# Patient Record
Sex: Male | Born: 1995 | Race: Black or African American | Hispanic: No | Marital: Single | Smoking: Never smoker
Health system: Southern US, Community
[De-identification: ages and names within clinical notes are randomized; demographics above are authoritative.]

## PROBLEM LIST (undated history)

## (undated) DIAGNOSIS — R748 Abnormal levels of other serum enzymes: Secondary | ICD-10-CM

## (undated) DIAGNOSIS — G7000 Myasthenia gravis without (acute) exacerbation: Secondary | ICD-10-CM

## (undated) HISTORY — PX: APPENDECTOMY: SHX54

## (undated) HISTORY — DX: Abnormal levels of other serum enzymes: R74.8

---

## 2017-01-10 ENCOUNTER — Observation Stay (HOSPITAL_COMMUNITY): Payer: No Typology Code available for payment source | Admitting: Certified Registered Nurse Anesthetist

## 2017-01-10 ENCOUNTER — Encounter (HOSPITAL_COMMUNITY): Payer: Self-pay | Admitting: Radiology

## 2017-01-10 ENCOUNTER — Observation Stay (HOSPITAL_COMMUNITY)
Admission: EM | Admit: 2017-01-10 | Discharge: 2017-01-11 | Disposition: A | Discharge status: Home / Self Care | Payer: No Typology Code available for payment source | Attending: General Surgery | Admitting: General Surgery

## 2017-01-10 ENCOUNTER — Emergency Department (HOSPITAL_COMMUNITY): Payer: No Typology Code available for payment source

## 2017-01-10 ENCOUNTER — Encounter (HOSPITAL_COMMUNITY)
Admission: EM | Disposition: A | Discharge status: Home / Self Care | Payer: Self-pay | Source: Home / Self Care | Attending: Emergency Medicine

## 2017-01-10 DIAGNOSIS — K37 Unspecified appendicitis: Secondary | ICD-10-CM | POA: Diagnosis present

## 2017-01-10 DIAGNOSIS — K353 Acute appendicitis with localized peritonitis, without perforation or gangrene: Secondary | ICD-10-CM

## 2017-01-10 HISTORY — PX: LAPAROSCOPIC APPENDECTOMY: SHX408

## 2017-01-10 LAB — URINALYSIS, COMPLETE (UACMP) WITH MICROSCOPIC
Bacteria, UA: NONE SEEN
Bilirubin Urine: NEGATIVE
Glucose, UA: NEGATIVE mg/dL
Hgb urine dipstick: NEGATIVE
Ketones, ur: NEGATIVE mg/dL
Leukocytes, UA: NEGATIVE
Nitrite: NEGATIVE
Protein, ur: 100 mg/dL — AB
Specific Gravity, Urine: 1.023 (ref 1.005–1.030)
pH: 7 (ref 5.0–8.0)

## 2017-01-10 LAB — CBC WITH DIFFERENTIAL/PLATELET
Basophils Absolute: 0 10*3/uL (ref 0.0–0.1)
Basophils Relative: 0 %
Eosinophils Absolute: 0 10*3/uL (ref 0.0–0.7)
Eosinophils Relative: 0 %
HCT: 48 % (ref 39.0–52.0)
Hemoglobin: 15.6 g/dL (ref 13.0–17.0)
Lymphocytes Relative: 6 %
Lymphs Abs: 0.9 10*3/uL (ref 0.7–4.0)
MCH: 29.5 pg (ref 26.0–34.0)
MCHC: 32.5 g/dL (ref 30.0–36.0)
MCV: 90.7 fL (ref 78.0–100.0)
Monocytes Absolute: 0.9 10*3/uL (ref 0.1–1.0)
Monocytes Relative: 6 %
Neutro Abs: 13.1 10*3/uL — ABNORMAL HIGH (ref 1.7–7.7)
Neutrophils Relative %: 88 %
Platelets: 263 10*3/uL (ref 150–400)
RBC: 5.29 MIL/uL (ref 4.22–5.81)
RDW: 12.8 % (ref 11.5–15.5)
WBC: 14.9 10*3/uL — ABNORMAL HIGH (ref 4.0–10.5)

## 2017-01-10 LAB — LIPASE, BLOOD: Lipase: 18 U/L (ref 11–51)

## 2017-01-10 LAB — COMPREHENSIVE METABOLIC PANEL
ALT: 54 U/L (ref 17–63)
AST: 36 U/L (ref 15–41)
Albumin: 4.9 g/dL (ref 3.5–5.0)
Alkaline Phosphatase: 60 U/L (ref 38–126)
Anion gap: 16 — ABNORMAL HIGH (ref 5–15)
BUN: 6 mg/dL (ref 6–20)
CO2: 21 mmol/L — ABNORMAL LOW (ref 22–32)
Calcium: 9.8 mg/dL (ref 8.9–10.3)
Chloride: 97 mmol/L — ABNORMAL LOW (ref 101–111)
Creatinine, Ser: 0.89 mg/dL (ref 0.61–1.24)
GFR calc Af Amer: >60 mL/min (ref >60)
GFR calc non Af Amer: >60 mL/min (ref >60)
Glucose, Bld: 131 mg/dL — ABNORMAL HIGH (ref 65–99)
Potassium: 4 mmol/L (ref 3.5–5.1)
Sodium: 134 mmol/L — ABNORMAL LOW (ref 135–145)
Total Bilirubin: 0.8 mg/dL (ref 0.3–1.2)
Total Protein: 8.7 g/dL — ABNORMAL HIGH (ref 6.5–8.1)

## 2017-01-10 SURGERY — APPENDECTOMY, LAPAROSCOPIC
Anesthesia: General | Site: Abdomen

## 2017-01-10 MED ORDER — PANTOPRAZOLE SODIUM 40 MG IV SOLR
40.0000 mg | Freq: Every day | INTRAVENOUS | Status: DC
  Administered 2017-01-10: 40 mg via INTRAVENOUS
  Filled 2017-01-10: qty 40

## 2017-01-10 MED ORDER — ACETAMINOPHEN 500 MG PO TABS
1000.0000 mg | ORAL_TABLET | Freq: Four times a day (QID) | ORAL | Status: DC
  Administered 2017-01-10 – 2017-01-11 (×4): 1000 mg via ORAL
  Filled 2017-01-10 (×4): qty 2

## 2017-01-10 MED ORDER — DIPHENHYDRAMINE HCL 50 MG/ML IJ SOLN: 12.5000 mg | Freq: Four times a day (QID) | INTRAMUSCULAR | Status: DC | PRN

## 2017-01-10 MED ORDER — PIPERACILLIN-TAZOBACTAM 3.375 G IVPB 30 MIN
3.3750 g | Freq: Once | INTRAVENOUS | Status: AC
  Administered 2017-01-10: 3.375 g via INTRAVENOUS
  Filled 2017-01-10: qty 50

## 2017-01-10 MED ORDER — SODIUM CHLORIDE 0.9 % IV SOLN
INTRAVENOUS | Status: DC
  Administered 2017-01-10 – 2017-01-11 (×3): via INTRAVENOUS

## 2017-01-10 MED ORDER — SODIUM CHLORIDE 0.9 % IV SOLN
INTRAVENOUS | Status: DC
  Administered 2017-01-10: 08:00:00 via INTRAVENOUS

## 2017-01-10 MED ORDER — FENTANYL CITRATE (PF) 100 MCG/2ML IJ SOLN
INTRAMUSCULAR | Status: AC
Start: 2017-01-10 — End: 2017-01-10
  Filled 2017-01-10: qty 4

## 2017-01-10 MED ORDER — CEFAZOLIN SODIUM 1 G IJ SOLR
INTRAMUSCULAR | Status: AC
  Filled 2017-01-10: qty 20

## 2017-01-10 MED ORDER — FENTANYL CITRATE (PF) 100 MCG/2ML IJ SOLN
100.0000 ug | Freq: Once | INTRAMUSCULAR | Status: AC
  Administered 2017-01-10: 100 ug via INTRAVENOUS
  Administered 2017-01-10: 50 ug via INTRAVENOUS
  Filled 2017-01-10: qty 2

## 2017-01-10 MED ORDER — ONDANSETRON HCL 4 MG/2ML IJ SOLN
4.0000 mg | Freq: Four times a day (QID) | INTRAMUSCULAR | Status: DC | PRN
  Administered 2017-01-10 (×2): 4 mg via INTRAVENOUS
  Filled 2017-01-10: qty 2

## 2017-01-10 MED ORDER — MIDAZOLAM HCL 5 MG/5ML IJ SOLN
INTRAMUSCULAR | Status: DC | PRN
Start: 2017-01-10 — End: 2017-01-10
  Administered 2017-01-10: 2 mg via INTRAVENOUS

## 2017-01-10 MED ORDER — ENOXAPARIN SODIUM 40 MG/0.4ML ~~LOC~~ SOLN
40.0000 mg | SUBCUTANEOUS | Status: DC
  Administered 2017-01-11: 40 mg via SUBCUTANEOUS
  Filled 2017-01-10: qty 0.4

## 2017-01-10 MED ORDER — HYDROMORPHONE HCL 2 MG/ML IJ SOLN
0.5000 mg | INTRAMUSCULAR | Status: DC | PRN
  Administered 2017-01-10: 1 mg via INTRAVENOUS
  Filled 2017-01-10: qty 1

## 2017-01-10 MED ORDER — MORPHINE SULFATE (PF) 4 MG/ML IV SOLN
4.0000 mg | Freq: Once | INTRAVENOUS | Status: AC
  Administered 2017-01-10: 4 mg via INTRAVENOUS
  Filled 2017-01-10: qty 1

## 2017-01-10 MED ORDER — CEFAZOLIN SODIUM 1 G IJ SOLR
INTRAMUSCULAR | Status: DC | PRN
  Administered 2017-01-10: 2 g via INTRAMUSCULAR

## 2017-01-10 MED ORDER — PIPERACILLIN-TAZOBACTAM 3.375 G IVPB
3.3750 g | Freq: Three times a day (TID) | INTRAVENOUS | Status: AC
  Administered 2017-01-10: 3.375 g via INTRAVENOUS
  Filled 2017-01-10: qty 50

## 2017-01-10 MED ORDER — DEXAMETHASONE SODIUM PHOSPHATE 10 MG/ML IJ SOLN
INTRAMUSCULAR | Status: AC
  Filled 2017-01-10: qty 1

## 2017-01-10 MED ORDER — SUGAMMADEX SODIUM 200 MG/2ML IV SOLN
INTRAVENOUS | Status: DC | PRN
  Administered 2017-01-10: 250 mg via INTRAVENOUS

## 2017-01-10 MED ORDER — BUPIVACAINE-EPINEPHRINE 0.25% -1:200000 IJ SOLN
INTRAMUSCULAR | Status: DC | PRN
  Administered 2017-01-10: 20 mL

## 2017-01-10 MED ORDER — OXYCODONE HCL 5 MG/5ML PO SOLN: 5.0000 mg | Freq: Once | ORAL | Status: DC | PRN

## 2017-01-10 MED ORDER — ONDANSETRON HCL 4 MG/2ML IJ SOLN
4.0000 mg | Freq: Once | INTRAMUSCULAR | Status: AC
  Administered 2017-01-10: 4 mg via INTRAVENOUS
  Filled 2017-01-10: qty 2

## 2017-01-10 MED ORDER — BUPIVACAINE HCL (PF) 0.25 % IJ SOLN
INTRAMUSCULAR | Status: AC
  Filled 2017-01-10: qty 30

## 2017-01-10 MED ORDER — SUGAMMADEX SODIUM 200 MG/2ML IV SOLN
INTRAVENOUS | Status: AC
  Filled 2017-01-10: qty 4

## 2017-01-10 MED ORDER — HYDROMORPHONE HCL 2 MG/ML IJ SOLN
1.0000 mg | Freq: Once | INTRAMUSCULAR | Status: AC
  Administered 2017-01-10: 1 mg via INTRAVENOUS
  Filled 2017-01-10: qty 1

## 2017-01-10 MED ORDER — HYDROMORPHONE HCL 2 MG/ML IJ SOLN: 0.5000 mg | INTRAMUSCULAR | Status: DC | PRN

## 2017-01-10 MED ORDER — FENTANYL CITRATE (PF) 100 MCG/2ML IJ SOLN
INTRAMUSCULAR | Status: AC
  Administered 2017-01-10: 100 ug
  Filled 2017-01-10: qty 2

## 2017-01-10 MED ORDER — DEXAMETHASONE SODIUM PHOSPHATE 4 MG/ML IJ SOLN
INTRAMUSCULAR | Status: DC | PRN
  Administered 2017-01-10: 10 mg via INTRAVENOUS

## 2017-01-10 MED ORDER — 0.9 % SODIUM CHLORIDE (POUR BTL) OPTIME
TOPICAL | Status: DC | PRN
  Administered 2017-01-10: 1000 mL

## 2017-01-10 MED ORDER — DIPHENHYDRAMINE HCL 12.5 MG/5ML PO ELIX: 12.5000 mg | ORAL_SOLUTION | Freq: Four times a day (QID) | ORAL | Status: DC | PRN

## 2017-01-10 MED ORDER — ONDANSETRON HCL 4 MG/2ML IJ SOLN
INTRAMUSCULAR | Status: AC
  Filled 2017-01-10: qty 2

## 2017-01-10 MED ORDER — OXYCODONE HCL 5 MG PO TABS
5.0000 mg | ORAL_TABLET | ORAL | Status: DC | PRN
  Administered 2017-01-10 – 2017-01-11 (×2): 5 mg via ORAL
  Filled 2017-01-10 (×2): qty 1

## 2017-01-10 MED ORDER — SODIUM CHLORIDE 0.9 % IR SOLN
Status: DC | PRN
  Administered 2017-01-10: 1000 mL

## 2017-01-10 MED ORDER — ROCURONIUM BROMIDE 100 MG/10ML IV SOLN
INTRAVENOUS | Status: DC | PRN
  Administered 2017-01-10: 50 mg via INTRAVENOUS

## 2017-01-10 MED ORDER — OXYCODONE HCL 5 MG PO TABS: 5.0000 mg | ORAL_TABLET | Freq: Once | ORAL | Status: DC | PRN

## 2017-01-10 MED ORDER — LACTATED RINGERS IV SOLN
INTRAVENOUS | Status: DC
  Administered 2017-01-10 (×2): via INTRAVENOUS

## 2017-01-10 MED ORDER — MIDAZOLAM HCL 2 MG/2ML IJ SOLN
INTRAMUSCULAR | Status: AC
  Filled 2017-01-10: qty 2

## 2017-01-10 MED ORDER — LIDOCAINE HCL (CARDIAC) 20 MG/ML IV SOLN
INTRAVENOUS | Status: DC | PRN
  Administered 2017-01-10: 80 mg via INTRAVENOUS

## 2017-01-10 MED ORDER — IOPAMIDOL (ISOVUE-300) INJECTION 61%
INTRAVENOUS | Status: AC
  Administered 2017-01-10: 100 mL
  Filled 2017-01-10: qty 100

## 2017-01-10 MED ORDER — FENTANYL CITRATE (PF) 100 MCG/2ML IJ SOLN: 25.0000 ug | INTRAMUSCULAR | Status: DC | PRN

## 2017-01-10 MED ORDER — PROPOFOL 10 MG/ML IV BOLUS
INTRAVENOUS | Status: DC | PRN
  Administered 2017-01-10: 200 mg via INTRAVENOUS

## 2017-01-10 MED ORDER — SODIUM CHLORIDE 0.9 % IV BOLUS (SEPSIS)
1000.0000 mL | Freq: Once | INTRAVENOUS | Status: AC
  Administered 2017-01-10: 1000 mL via INTRAVENOUS

## 2017-01-10 SURGICAL SUPPLY — 54 items
APPLIER CLIP ROT 10 11.4 M/L (STAPLE)
BANDAGE ADH SHEER 1  50/CT (GAUZE/BANDAGES/DRESSINGS) ×4 IMPLANT
BENZOIN TINCTURE PRP APPL 2/3 (GAUZE/BANDAGES/DRESSINGS) ×4 IMPLANT
BLADE SURG ROTATE 9660 (MISCELLANEOUS) IMPLANT
CANISTER SUCTION 2500CC (MISCELLANEOUS) ×2 IMPLANT
CHLORAPREP W/TINT 26ML (MISCELLANEOUS) ×2 IMPLANT
CLIP APPLIE ROT 10 11.4 M/L (STAPLE) IMPLANT
CLSR STERI-STRIP ANTIMIC 1/2X4 (GAUZE/BANDAGES/DRESSINGS) ×4 IMPLANT
CONT SPEC 4OZ CLIKSEAL STRL BL (MISCELLANEOUS) ×2 IMPLANT
COVER SURGICAL LIGHT HANDLE (MISCELLANEOUS) ×2 IMPLANT
CUTTER FLEX LINEAR 45M (STAPLE) ×2 IMPLANT
DERMABOND ADVANCED (GAUZE/BANDAGES/DRESSINGS)
DERMABOND ADVANCED .7 DNX12 (GAUZE/BANDAGES/DRESSINGS) IMPLANT
DRSG TEGADERM 4X4.75 (GAUZE/BANDAGES/DRESSINGS) ×2 IMPLANT
ELECT REM PT RETURN 9FT ADLT (ELECTROSURGICAL) ×2
ELECTRODE REM PT RTRN 9FT ADLT (ELECTROSURGICAL) ×1 IMPLANT
ENDOLOOP SUT PDS II  0 18 (SUTURE)
ENDOLOOP SUT PDS II 0 18 (SUTURE) IMPLANT
GAUZE SPONGE 2X2 8PLY STRL LF (GAUZE/BANDAGES/DRESSINGS) ×1 IMPLANT
GLOVE BIO SURGEON STRL SZ7.5 (GLOVE) ×2 IMPLANT
GLOVE BIOGEL M STRL SZ7.5 (GLOVE) ×2 IMPLANT
GLOVE BIOGEL PI IND STRL 6.5 (GLOVE) ×1 IMPLANT
GLOVE BIOGEL PI IND STRL 8 (GLOVE) ×1 IMPLANT
GLOVE BIOGEL PI INDICATOR 6.5 (GLOVE) ×1
GLOVE BIOGEL PI INDICATOR 8 (GLOVE) ×1
GOWN STRL REUS W/ TWL LRG LVL3 (GOWN DISPOSABLE) ×3 IMPLANT
GOWN STRL REUS W/TWL 2XL LVL3 (GOWN DISPOSABLE) ×2 IMPLANT
GOWN STRL REUS W/TWL LRG LVL3 (GOWN DISPOSABLE) ×3
GRASPER SUT TROCAR 14GX15 (MISCELLANEOUS) IMPLANT
IRRIG SUCT STRYKERFLOW 2 WTIP (MISCELLANEOUS) ×2
IRRIGATION SUCT STRKRFLW 2 WTP (MISCELLANEOUS) ×1 IMPLANT
KIT BASIN OR (CUSTOM PROCEDURE TRAY) ×2 IMPLANT
KIT ROOM TURNOVER OR (KITS) ×2 IMPLANT
NS IRRIG 1000ML POUR BTL (IV SOLUTION) ×2 IMPLANT
PAD ARMBOARD 7.5X6 YLW CONV (MISCELLANEOUS) ×4 IMPLANT
POUCH RETRIEVAL ECOSAC 10 (ENDOMECHANICALS) ×1 IMPLANT
POUCH RETRIEVAL ECOSAC 10MM (ENDOMECHANICALS) ×1
RELOAD 45 VASCULAR/THIN (ENDOMECHANICALS) ×4 IMPLANT
RELOAD STAPLE TA45 3.5 REG BLU (ENDOMECHANICALS) IMPLANT
SCALPEL HARMONIC ACE (MISCELLANEOUS) ×2 IMPLANT
SCISSORS LAP 5X35 DISP (ENDOMECHANICALS) IMPLANT
SET IRRIG TUBING LAPAROSCOPIC (IRRIGATION / IRRIGATOR) IMPLANT
SPECIMEN JAR SMALL (MISCELLANEOUS) ×2 IMPLANT
SPONGE GAUZE 2X2 STER 10/PKG (GAUZE/BANDAGES/DRESSINGS) ×1
STRIP CLOSURE SKIN 1/2X4 (GAUZE/BANDAGES/DRESSINGS) ×2 IMPLANT
SUT MNCRL AB 4-0 PS2 18 (SUTURE) ×2 IMPLANT
SUT VICRYL 0 UR6 27IN ABS (SUTURE) IMPLANT
TOWEL OR 17X24 6PK STRL BLUE (TOWEL DISPOSABLE) ×2 IMPLANT
TOWEL OR 17X26 10 PK STRL BLUE (TOWEL DISPOSABLE) IMPLANT
TRAY FOLEY CATH 16FR SILVER (SET/KITS/TRAYS/PACK) ×2 IMPLANT
TRAY LAPAROSCOPIC MC (CUSTOM PROCEDURE TRAY) ×2 IMPLANT
TROCAR XCEL BLADELESS 5X75MML (TROCAR) ×4 IMPLANT
TROCAR XCEL BLUNT TIP 100MML (ENDOMECHANICALS) ×2 IMPLANT
TUBING INSUFFLATION (TUBING) ×2 IMPLANT

## 2017-01-10 NOTE — Anesthesia Procedure Notes (Signed)
Procedure Name: Intubation Date/Time: 01/10/2017 5:13 PM Performed by: Oletta Lamas Pre-anesthesia Checklist: Patient identified, Emergency Drugs available, Suction available and Patient being monitored Patient Re-evaluated:Patient Re-evaluated prior to inductionOxygen Delivery Method: Circle System Utilized Preoxygenation: Pre-oxygenation with 100% oxygen Intubation Type: IV induction Ventilation: Mask ventilation without difficulty Laryngoscope Size: Mac and 4 Tube type: Oral Tube size: 7.5 mm Number of attempts: 1 Airway Equipment and Method: Stylet Placement Confirmation: ETT inserted through vocal cords under direct vision,  positive ETCO2 and breath sounds checked- equal and bilateral Secured at: 23 cm Tube secured with: Tape Dental Injury: Teeth and Oropharynx as per pre-operative assessment

## 2017-01-10 NOTE — ED Notes (Signed)
Patient at CT

## 2017-01-10 NOTE — Transfer of Care (Signed)
Immediate Anesthesia Transfer of Care Note  Patient: Dylan Niannthony Darnell Flow Jr.  Procedure(s) Performed: Procedure(s): LAPAROSCOPIC APPENDECTOMY (N/A)  Patient Location: PACU  Anesthesia Type:General  Level of Consciousness: awake, alert , oriented and patient cooperative  Airway & Oxygen Therapy: Patient Spontanous Breathing and Patient connected to nasal cannula oxygen  Post-op Assessment: Report given to RN and Post -op Vital signs reviewed and stable  Post vital signs: Reviewed and stable  Last Vitals:  Vitals:   01/10/17 1945 01/10/17 2000  BP:    Pulse: (!) 110 (!) 110  Resp: 18 20  Temp:      Last Pain:  Vitals:   01/10/17 1901  TempSrc:   PainSc: 0-No pain      Patients Stated Pain Goal: 10 (01/10/17 1651)  Complications: No apparent anesthesia complications

## 2017-01-10 NOTE — Anesthesia Postprocedure Evaluation (Addendum)
Anesthesia Post Note  Patient: Lavone Niannthony Darnell Cavallero Jr.  Procedure(s) Performed: Procedure(s) (LRB): LAPAROSCOPIC APPENDECTOMY (N/A)  Patient location during evaluation: PACU Anesthesia Type: General Level of consciousness: sedated Pain management: pain level controlled Vital Signs Assessment: post-procedure vital signs reviewed and stable Respiratory status: spontaneous breathing and respiratory function stable Cardiovascular status: stable Anesthetic complications: no       Last Vitals:  Vitals:   01/10/17 1945 01/10/17 2000  BP:    Pulse: (!) 110 (!) 110  Resp: 18 20  Temp:      Last Pain:  Vitals:   01/10/17 1901  TempSrc:   PainSc: 0-No pain                 Belva Koziel DANIEL

## 2017-01-10 NOTE — H&P (Signed)
[facility] Consult/Admission Note  Dylan [name]. [birth year]  [id number].    Requesting MD: Dr. Gilford Raid Chief Complaint/Reason for Consult: Appendicitis  HPI:   Patient is a 21 year old African-American male with no significant past medical history who presented to the [facility] ED with complaints of abdominal pain for 3 days. Patient states on Wednesday he began having mild abdominal pain the middle of his abdomen that progressively worsened to constant, severe, sharp, abdominal pain that radiated into his back. Patient states yesterday the pain moved to his right lower quadrant. Associated nausea and vomiting. Nothing made his pain better or worse. Patient denies fever, chills, diarrhea, dysuria, hematuria, chest pain, shortness of breath. Patient has not had a bowel movement in 2 days. Patient is not on blood thinners.  ED course: Labs: WBC 14.9 with left shift, anion gap 16, all other labs unremarkable, VSS CT abdomen and pelvis reveals appendicitis with adjacent stranding and distention measuring up to 8 mm. No evidence of perforation at the time of the study.  ROS:  Review of Systems  Constitutional: Negative for chills, diaphoresis and fever.  HENT: Negative for sore throat.   Eyes: Negative for discharge.  Respiratory: Negative for shortness of breath.   Cardiovascular: Negative for chest pain.  Gastrointestinal: Positive for abdominal pain, nausea and vomiting. Negative for blood in stool and diarrhea.  Genitourinary: Negative for dysuria and hematuria.  Skin: Negative for rash.  Neurological: Negative for dizziness, loss of consciousness and headaches.  All other systems reviewed and are negative.    No family history on file.  History reviewed. No pertinent past medical history.  No past surgical history on file.  Social History:  has no tobacco, alcohol, and drug history on file.  Allergies: No Known Allergies   (Not in a hospital  admission)  Blood pressure 150/92, pulse 91, temperature 97.7 F (36.5 C), temperature source Oral, resp. rate 20, height '5\' 10"'  (1.778 m), weight 250 lb (113.4 kg), SpO2 98 %.  Physical Exam  Constitutional: He is oriented to person, place, and time and well-developed, well-nourished, and in no distress. Vital signs are normal.  Non-toxic appearance. No distress.  HENT:  Head: Normocephalic and atraumatic.  Eyes: Conjunctivae are normal. Pupils are equal, round, and reactive to light. Right eye exhibits no discharge. Left eye exhibits no discharge. No scleral icterus.  Neck: Normal range of motion. Neck supple.  Cardiovascular: Normal rate, regular rhythm, normal heart sounds and intact distal pulses.  Exam reveals no gallop and no friction rub.   No murmur heard. Pulses:      Radial pulses are 2+ on the right side, and 2+ on the left side.       Dorsalis pedis pulses are 2+ on the right side, and 2+ on the left side.  Pulmonary/Chest: Effort normal and breath sounds normal. No respiratory distress. He has no wheezes. He has no rales.  Abdominal: Soft. Normal appearance and bowel sounds are normal. He exhibits no distension. There is generalized tenderness. There is guarding. There is no rigidity.  Moderate generalized TTP  Musculoskeletal: Normal range of motion. He exhibits no edema or deformity.  Neurological: He is alert and oriented to person, place, and time. GCS score is 15.  Skin: Skin is warm and dry. No rash noted. He is not diaphoretic.  Psychiatric: Mood and affect normal.  Nursing note and vitals reviewed.   Results for orders placed or performed during the hospital encounter of 01/10/17 (from the past 48  hour(s))  Comprehensive metabolic panel     Status: Abnormal   Collection Time: 01/10/17  7:31 AM  Result Value Ref Range   Sodium 134 (L) 135 - 145 mmol/L   Potassium 4.0 3.5 - 5.1 mmol/L   Chloride 97 (L) 101 - 111 mmol/L   CO2 21 (L) 22 - 32 mmol/L   Glucose, Bld  131 (H) 65 - 99 mg/dL   BUN 6 6 - 20 mg/dL   Creatinine, Ser 0.89 0.61 - 1.24 mg/dL   Calcium 9.8 8.9 - 10.3 mg/dL   Total Protein 8.7 (H) 6.5 - 8.1 g/dL   Albumin 4.9 3.5 - 5.0 g/dL   AST 36 15 - 41 U/L   ALT 54 17 - 63 U/L   Alkaline Phosphatase 60 38 - 126 U/L   Total Bilirubin 0.8 0.3 - 1.2 mg/dL   GFR calc non Af Amer >60 >60 mL/min   GFR calc Af Amer >60 >60 mL/min    Comment: (NOTE) The eGFR has been calculated using the CKD EPI equation. This calculation has not been validated in all clinical situations. eGFR's persistently <60 mL/min signify possible Chronic Kidney Disease.    Anion gap 16 (H) 5 - 15  Lipase, blood     Status: None   Collection Time: 01/10/17  7:31 AM  Result Value Ref Range   Lipase 18 11 - 51 U/L  CBC WITH DIFFERENTIAL     Status: Abnormal   Collection Time: 01/10/17  7:31 AM  Result Value Ref Range   WBC 14.9 (H) 4.0 - 10.5 K/uL   RBC 5.29 4.22 - 5.81 MIL/uL   Hemoglobin 15.6 13.0 - 17.0 g/dL   HCT 48.0 39.0 - 52.0 %   MCV 90.7 78.0 - 100.0 fL   MCH 29.5 26.0 - 34.0 pg   MCHC 32.5 30.0 - 36.0 g/dL   RDW 12.8 11.5 - 15.5 %   Platelets 263 150 - 400 K/uL   Neutrophils Relative % 88 %   Neutro Abs 13.1 (H) 1.7 - 7.7 K/uL   Lymphocytes Relative 6 %   Lymphs Abs 0.9 0.7 - 4.0 K/uL   Monocytes Relative 6 %   Monocytes Absolute 0.9 0.1 - 1.0 K/uL   Eosinophils Relative 0 %   Eosinophils Absolute 0.0 0.0 - 0.7 K/uL   Basophils Relative 0 %   Basophils Absolute 0.0 0.0 - 0.1 K/uL  Urinalysis, Complete w Microscopic     Status: Abnormal   Collection Time: 01/10/17  7:42 AM  Result Value Ref Range   Color, Urine YELLOW YELLOW   APPearance CLEAR CLEAR   Specific Gravity, Urine 1.023 1.005 - 1.030   pH 7.0 5.0 - 8.0   Glucose, UA NEGATIVE NEGATIVE mg/dL   Hgb urine dipstick NEGATIVE NEGATIVE   Bilirubin Urine NEGATIVE NEGATIVE   Ketones, ur NEGATIVE NEGATIVE mg/dL   Protein, ur 100 (A) NEGATIVE mg/dL   Nitrite NEGATIVE NEGATIVE   Leukocytes,  UA NEGATIVE NEGATIVE   RBC / HPF 0-5 0 - 5 RBC/hpf   WBC, UA 0-5 0 - 5 WBC/hpf   Bacteria, UA NONE SEEN NONE SEEN   Squamous Epithelial / LPF 0-5 (A) NONE SEEN   Mucous PRESENT    Hyaline Casts, UA PRESENT    Ct Abdomen Pelvis W Contrast  Result Date: 01/10/2017 CLINICAL DATA:  Abdominal pain for 2 days. No bowel movement for 3 days. Vomiting. EXAM: CT ABDOMEN AND PELVIS WITH CONTRAST TECHNIQUE: Multidetector CT imaging of the abdomen  and pelvis was performed using the standard protocol following bolus administration of intravenous contrast. CONTRAST:  180m ISOVUE-300 IOPAMIDOL (ISOVUE-300) INJECTION 61% COMPARISON:  KUB from earlier today FINDINGS: Lower chest: No acute abnormality. Hepatobiliary: No focal liver abnormality is seen. No gallstones, gallbladder wall thickening, or biliary dilatation. Pancreas: Unremarkable. No pancreatic ductal dilatation or surrounding inflammatory changes. Spleen: Normal in size without focal abnormality. Adrenals/Urinary Tract: Adrenal glands are unremarkable. Kidneys are normal, without renal calculi, focal lesion, or hydronephrosis. Bladder is unremarkable. Stomach/Bowel: The stomach and small bowel are normal. The colon is normal. The appendix is abnormal with distension and adjacent stranding consistent with appendicitis. The appendix contains fecal material and measures up to 18 mm. Vascular/Lymphatic: No significant vascular findings are present. No enlarged abdominal or pelvic lymph nodes. Reproductive: Prostate is unremarkable. Other: Increased attenuation in the mesentery is likely secondary to the appendicitis. Musculoskeletal: No acute or significant osseous findings. IMPRESSION: 1. Appendicitis with adjacent stranding and distention measuring up to 18 mm. No evidence of perforation at the time of this study. Electronically Signed   By: DDorise BullionIII M.D   On: 01/10/2017 10:34   Dg Abd Acute W/chest  Result Date: 01/10/2017 CLINICAL DATA:  Nausea and  vomiting.  Generalized abdominal pain. EXAM: DG ABDOMEN ACUTE W/ 1V CHEST COMPARISON:  None. FINDINGS: Normal heart size. Normal mediastinal contour. No pneumothorax. No pleural effusion. Lungs appear clear, with no acute consolidative airspace disease and no pulmonary edema. No dilated small bowel loops or air-fluid levels. Mild-to-moderate colonic stool volume. No evidence of pneumatosis or pneumoperitoneum. No pathologic soft tissue calcifications. IMPRESSION: 1. No active disease in the chest. 2. Nonobstructive bowel gas pattern. Electronically Signed   By: JIlona SorrelM.D.   On: 01/10/2017 07:57      Assessment/Plan  Appendicitis - Leukocytosis, afebrile - Will take patient to the OR today for laparoscopic appendectomy  FEN: NPO, IVF, Protonix VTE: SCD's  ID: [id number] 12/2>>  Plan: Patient will go to OR today for laparoscopic appendectomy  Thank you for the consult.  JKalman Drape [facility]am[facility] HospitalSur[facility] 01/10/2017, 11:34 AM Pager: [phone]: [phone]-Fri 7:00 am-4:30 pm Sat-Sun 7:00 am-11:30 am

## 2017-01-10 NOTE — Anesthesia Preprocedure Evaluation (Addendum)
Anesthesia Evaluation  Patient identified by MRN, date of birth, ID band Patient awake    Reviewed: Allergy & Precautions, NPO status , Patient's Chart, lab work & pertinent test results  History of Anesthesia Complications Negative for: history of anesthetic complications  Airway Mallampati: II  TM Distance: >3 FB Neck ROM: Full    Dental  (+) Teeth Intact,    Pulmonary neg pulmonary ROS,    breath sounds clear to auscultation       Cardiovascular negative cardio ROS   Rhythm:Regular     Neuro/Psych negative neurological ROS     GI/Hepatic Neg liver ROS, Acute appendicitis   Endo/Other  Morbid obesity  Renal/GU negative Renal ROS     Musculoskeletal   Abdominal   Peds  Hematology negative hematology ROS (+)   Anesthesia Other Findings   Reproductive/Obstetrics                            Lab Results  Component Value Date   WBC 14.9 (H) 01/10/2017   HGB 15.6 01/10/2017   HCT 48.0 01/10/2017   MCV 90.7 01/10/2017   PLT 263 01/10/2017   Lab Results  Component Value Date   CREATININE 0.89 01/10/2017   BUN 6 01/10/2017   NA 134 (L) 01/10/2017   K 4.0 01/10/2017   CL 97 (L) 01/10/2017   CO2 21 (L) 01/10/2017    Anesthesia Physical Anesthesia Plan  ASA: II  Anesthesia Plan: General   Post-op Pain Management:    Induction: Intravenous  Airway Management Planned: Oral ETT  Additional Equipment: None  Intra-op Plan:   Post-operative Plan: Extubation in OR  Informed Consent: I have reviewed the patients History and Physical, chart, labs and discussed the procedure including the risks, benefits and alternatives for the proposed anesthesia with the patient or authorized representative who has indicated his/her understanding and acceptance.   Dental advisory given  Plan Discussed with: CRNA and Surgeon  Anesthesia Plan Comments:        Anesthesia Quick  Evaluation

## 2017-01-10 NOTE — ED Notes (Signed)
Unsuccessful attempt at giving report to Short Stay.

## 2017-01-10 NOTE — ED Triage Notes (Signed)
Patient comes in with c/o n/v starting yesterday. Patient had bm two days ago. Took laxative yesterday. Started vomiting post med. Total 7x in 24 hrs. Generalized abd pain. No significant hx.

## 2017-01-10 NOTE — Op Note (Signed)
Dylan [name] [id number] [birth year] 01/10/2017  Appendectomy, Lap, Procedure Note  Indications: The patient presented with a history of right-sided abdominal pain. A CT revealed findings consistent with acute appendicitis.  Pre-operative Diagnosis: Acute appendicitis without mention of peritonitis  Post-operative Diagnosis: Same  Surgeon: Atilano Ina   Assistants: none  Anesthesia: General endotracheal anesthesia  Procedure Details  The patient was seen again in the Holding Room. The risks, benefits, complications, treatment options, and expected outcomes were discussed with the patient and/or family. The possibilities of perforation of viscus, bleeding, recurrent infection, the need for additional procedures, failure to diagnose a condition, and creating a complication requiring transfusion or operation were discussed. There was concurrence with the proposed plan and informed consent was obtained. The site of surgery was properly noted. The patient was taken to Operating Room, identified as Dylan [name]. and the procedure verified as Appendectomy. A Time Out was held and the above information confirmed.  The patient was placed in the supine position and general anesthesia was induced, along with placement of orogastric tube, SCDs, and a Foley catheter. The abdomen was prepped and draped in a sterile fashion. A 1.5 centimeter infraumbilical incision was made.  The umbilical stalk was elevated, and the midline fascia was incised with a #11 blade.  A Kelly clamp was used to confirm entrance into the peritoneal cavity.  A pursestring suture was passed around the incision with a 0 Vicryl.  A 12mm Hasson was introduced into the abdomen and the tails of the suture were used to hold the Hasson in place.   The pneumoperitoneum was then established to steady pressure of 15 mmHg.  Additional 5 mm cannulas then placed in the left lower quadrant of the abdomen and the suprapubic  region under direct visualization. A careful evaluation of the entire abdomen was carried out. The patient was placed in Trendelenburg and left lateral decubitus position. The small intestines were retracted in the cephalad and left lateral direction away from the pelvis and right lower quadrant. The patient was found to have an inflammed appendix that was stuck to the right pelvic inlet. There was no evidence of perforation.  The appendix was carefully dissected. It became evident that the appendix was retro-ileal. I ended up mobilizing some the cecal base laterally because I felt that was the easier way to approach the appendix base the posterior veil of TI mesentery was covering the midportion of the appendix. When I did this I was able to visualize the path of the proximal appendix going into the cecum. It appeared that the appendix was coming off of the cecum slightly medial. The TI fat pad was reflected upward. The appendix was was skeletonized with the harmonic scalpel.   The appendix was divided at its base using an endo-GIA stapler with a white load. No appendiceal stump was left in place. I stapled parallel to the junction of the TI and cecum.. The appendix was removed from the abdomen with an Endocatch bag through the umbilical port.  There was no evidence of bleeding, leakage, or complication after division of the appendix. Irrigation was also performed and irrigate suctioned from the abdomen as well.  The umbilical port site was closed with the purse string suture. The closure was viewed laparoscopically. There was no residual palpable fascial defect.  The trocar site skin wounds were closed with 4-0 Monocryl. Benzoin, steri strips, bandages were applied to the skin incisions.  Instrument, sponge, and needle counts were correct at the  conclusion of the case.   Findings: The appendix was found to be inflamed. There were not signs of necrosis.  There was not perforation. There was not abscess  formation.  Estimated Blood Loss:  Minimal         Drains: none         Specimens: appendix         Complications:  None; patient tolerated the procedure well.         Disposition: PACU - hemodynamically stable.         Condition: stable  Mary SellaEric M. Andrey CampanileWilson, MD, FACS General, Bariatric, & Minimally Invasive Surgery [facility], [place]

## 2017-01-10 NOTE — ED Provider Notes (Signed)
[facility]-EMERGENCY DEPT Provider Note   CSN: [id number] Arrival date & time: 01/10/17  0650     History   Chief Complaint Chief Complaint  Patient presents with  . Abdominal Pain  . Emesis    HPI Dylan Niannthony Darnell Alderman Jr. is a 21 y.o. male.  Pt presents to the ED today with abdominal pain and n/v.  The pt had not had a bm in 2 days, so he took a laxative.  That made him vomit more.  The pt said his pain is diffuse.  The pt denies f/c or diarrhea.      History reviewed. No pertinent past medical history.  There are no active problems to display for this patient.   No past surgical history on file.     Home Medications    Prior to Admission medications   Not on File    Family History No family history on file.  Social History Social History  Substance Use Topics  . Smoking status: Not on file  . Smokeless tobacco: Not on file  . Alcohol use Not on file     Allergies   Patient has no known allergies.   Review of Systems Review of Systems  Gastrointestinal: Positive for abdominal pain and vomiting.  All other systems reviewed and are negative.    Physical Exam Updated Vital Signs BP 150/91 (BP Location: Right Arm) Comment: Simultaneous filing. User may not have seen previous data.  Pulse 87 Comment: Simultaneous filing. User may not have seen previous data.  Temp 97.7 F (36.5 C) (Oral)   Resp 20   Ht 5\' 10"  (1.778 m)   Wt 250 lb (113.4 kg)   SpO2 99% Comment: Simultaneous filing. User may not have seen previous data.  BMI 35.87 kg/m   Physical Exam  Constitutional: He is oriented to person, place, and time. He appears well-developed and well-nourished.  HENT:  Head: Normocephalic and atraumatic.  Right Ear: External ear normal.  Left Ear: External ear normal.  Nose: Nose normal.  Mouth/Throat: Mucous membranes are dry.  Eyes: Conjunctivae and EOM are normal. Pupils are equal, round, and reactive to light.  Neck: Normal range of motion. Neck  supple.  Cardiovascular: Normal rate, regular rhythm, normal heart sounds and intact distal pulses.   Pulmonary/Chest: Effort normal and breath sounds normal.  Abdominal: Soft. Bowel sounds are normal. There is generalized tenderness.  Musculoskeletal: Normal range of motion.  Neurological: He is alert and oriented to person, place, and time.  Skin: Skin is warm and dry.  Psychiatric: He has a normal mood and affect. His behavior is normal. Judgment and thought content normal.  Nursing note and vitals reviewed.    ED Treatments / Results  Labs (all labs ordered are listed, but only abnormal results are displayed) Labs Reviewed  COMPREHENSIVE METABOLIC PANEL - Abnormal; Notable for the following:       Result Value   Sodium 134 (*)    Chloride 97 (*)    CO2 21 (*)    Glucose, Bld 131 (*)    Total Protein 8.7 (*)    Anion gap 16 (*)    All other components within normal limits  URINALYSIS, COMPLETE (UACMP) WITH MICROSCOPIC - Abnormal; Notable for the following:    Protein, ur 100 (*)    Squamous Epithelial / LPF 0-5 (*)    All other components within normal limits  CBC WITH DIFFERENTIAL/PLATELET - Abnormal; Notable for the following:    WBC 14.9 (*)  Neutro Abs 13.1 (*)    All other components within normal limits  LIPASE, BLOOD    EKG  EKG Interpretation None       Radiology Ct Abdomen Pelvis W Contrast  Result Date: 01/10/2017 CLINICAL DATA:  Abdominal pain for 2 days. No bowel movement for 3 days. Vomiting. EXAM: CT ABDOMEN AND PELVIS WITH CONTRAST TECHNIQUE: Multidetector CT imaging of the abdomen and pelvis was performed using the standard protocol following bolus administration of intravenous contrast. CONTRAST:  ISOVUE-300 IOPAMIDOL (ISOVUE-300) INJECTION 61% COMPARISON:  KUB from earlier today FINDINGS: Lower chest: No acute abnormality. Hepatobiliary: No focal liver abnormality is seen. No gallstones, gallbladder wall thickening, or biliary dilatation.  Pancreas: Unremarkable. No pancreatic ductal dilatation or surrounding inflammatory changes. Spleen: Normal in size without focal abnormality. Adrenals/Urinary Tract: Adrenal glands are unremarkable. Kidneys are normal, without renal calculi, focal lesion, or hydronephrosis. Bladder is unremarkable. Stomach/Bowel: The stomach and small bowel are normal. The colon is normal. The appendix is abnormal with distension and adjacent stranding consistent with appendicitis. The appendix contains fecal material and measures up to 18 mm. Vascular/Lymphatic: No significant vascular findings are present. No enlarged abdominal or pelvic lymph nodes. Reproductive: Prostate is unremarkable. Other: Increased attenuation in the mesentery is likely secondary to the appendicitis. Musculoskeletal: No acute or significant osseous findings. IMPRESSION: 1. Appendicitis with adjacent stranding and distention measuring up to 18 mm. No evidence of perforation at the time of this study. Electronically Signed   By: Gerome Sam III M.D   On: 01/10/2017 10:34   Dg Abd Acute W/chest  Result Date: 01/10/2017 CLINICAL DATA:  Nausea and vomiting.  Generalized abdominal pain. EXAM: DG ABDOMEN ACUTE W/ 1V CHEST COMPARISON:  None. FINDINGS: Normal heart size. Normal mediastinal contour. No pneumothorax. No pleural effusion. Lungs appear clear, with no acute consolidative airspace disease and no pulmonary edema. No dilated small bowel loops or air-fluid levels. Mild-to-moderate colonic stool volume. No evidence of pneumatosis or pneumoperitoneum. No pathologic soft tissue calcifications. IMPRESSION: 1. No active disease in the chest. 2. Nonobstructive bowel gas pattern. Electronically Signed   By: Delbert Phenix M.D.   On: 01/10/2017 07:57    Procedures Procedures (including critical care time)  Medications Ordered in ED Medications  sodium chloride 0.9 % bolus 1,000 mL (0 mLs Intravenous Stopped 01/10/17 1102)    And  0.9 %  sodium  chloride infusion ( Intravenous New Bag/Given 01/10/17 0741)  piperacillin-tazobactam (ZOSYN) IVPB 3.375 g (3.375 g Intravenous New Bag/Given 01/10/17 1102)  ondansetron (ZOFRAN) injection 4 mg (4 mg Intravenous Given 01/10/17 0739)  morphine 4 MG/ML injection 4 mg (4 mg Intravenous Given 01/10/17 0739)  iopamidol (ISOVUE-300) 61 % injection (100 mLs  Contrast Given 01/10/17 1002)  HYDROmorphone (DILAUDID) injection 1 mg (1 mg Intravenous Given 01/10/17 0927)  HYDROmorphone (DILAUDID) injection 1 mg (1 mg Intravenous Given 01/10/17 1102)     Initial Impression / Assessment and Plan / ED Course  I have reviewed the triage vital signs and the nursing notes.  Pertinent labs & imaging results that were available during my care of the patient were reviewed by me and considered in my medical decision making (see chart for details).    Pt d/w surgery who will be down to eval pt and likely take to the OR.  Final Clinical Impressions(s) / ED Diagnoses   Final diagnoses:  Acute appendicitis with localized peritonitis    New Prescriptions New Prescriptions   No medications on file  Jacalyn Lefevre, MD 01/10/17 [phone]

## 2017-01-10 NOTE — ED Notes (Signed)
Patient taken to xray.

## 2017-01-11 ENCOUNTER — Encounter (HOSPITAL_COMMUNITY): Payer: Self-pay | Admitting: General Surgery

## 2017-01-11 LAB — CBC
HCT: 42.2 % (ref 39.0–52.0)
Hemoglobin: 13.9 g/dL (ref 13.0–17.0)
MCH: 29.9 pg (ref 26.0–34.0)
MCHC: 32.9 g/dL (ref 30.0–36.0)
MCV: 90.8 fL (ref 78.0–100.0)
Platelets: 240 10*3/uL (ref 150–400)
RBC: 4.65 MIL/uL (ref 4.22–5.81)
RDW: 12.9 % (ref 11.5–15.5)
WBC: 18.9 10*3/uL — ABNORMAL HIGH (ref 4.0–10.5)

## 2017-01-11 LAB — BASIC METABOLIC PANEL
Anion gap: 8 (ref 5–15)
BUN: 9 mg/dL (ref 6–20)
CO2: 26 mmol/L (ref 22–32)
Calcium: 8.9 mg/dL (ref 8.9–10.3)
Chloride: 104 mmol/L (ref 101–111)
Creatinine, Ser: 0.84 mg/dL (ref 0.61–1.24)
GFR calc Af Amer: >60 mL/min (ref >60)
GFR calc non Af Amer: >60 mL/min (ref >60)
Glucose, Bld: 120 mg/dL — ABNORMAL HIGH (ref 65–99)
Potassium: 3.8 mmol/L (ref 3.5–5.1)
Sodium: 138 mmol/L (ref 135–145)

## 2017-01-11 MED ORDER — SODIUM CHLORIDE 0.9 % IV SOLN
250.0000 mL | INTRAVENOUS | Status: DC | PRN
Start: 2017-01-11 — End: 2017-01-11

## 2017-01-11 MED ORDER — SODIUM CHLORIDE 0.9% FLUSH
3.0000 mL | Freq: Two times a day (BID) | INTRAVENOUS | Status: DC
  Administered 2017-01-11: 3 mL via INTRAVENOUS

## 2017-01-11 MED ORDER — OXYCODONE HCL 5 MG PO TABS: 5.0000 mg | ORAL_TABLET | ORAL | 0 refills | Status: DC | PRN

## 2017-01-11 MED ORDER — SODIUM CHLORIDE 0.9% FLUSH
3.0000 mL | INTRAVENOUS | Status: DC | PRN
Start: 2017-01-11 — End: 2017-01-11

## 2017-01-11 NOTE — Progress Notes (Signed)
Received patient from PACU from lap appe surgery.  Patient AOx4, ambulatory, VS stable, pain at 5/10 and O2Sat at 98% on RA.  Patient oriented to room, bed controls and call light.  Patient then ambulated to bath room to void.  Administered PRN pain medication Oxy IR 5mg  PO as per order.  Patient now resting on bed comfortably watching TV with family members at bedside.  Will monitor.

## 2017-01-11 NOTE — Discharge Planning (Signed)
Patient IV removed.  Discharge papers given, explained and educated.  Informed of suggested FU appt and asked to call Dr. On Monday to set up.  Script given to patient.  RN assessment and VS revealed stability for DC to home was ready.  Will be wheeled to front and family transporting home via car.

## 2017-01-11 NOTE — Discharge Summary (Signed)
Patient ID: Dylan [name]. [id number] 20 y.o. 11/08/1996  01/10/2017  Discharge date and time: No discharge date for patient encounter.  Admitting Physician: Gaynelle AduEric Wilson, MD  Discharge Physician: Vanita PandaHOMAS, Uilani Sanville C.  Admission Diagnoses: Acute appendicitis with localized peritonitis [K35.3]  Discharge Diagnoses: acute appendicitis  Operations: Procedure(s): LAPAROSCOPIC APPENDECTOMY    Discharged Condition: good    Hospital Course: Pt admitted for overnight observation.  Did well  Consults: None  Significant Diagnostic Studies: labs: cbc, chemistry  Treatments: IV hydration, abx  Disposition: Home

## 2017-01-11 NOTE — Discharge Instructions (Signed)

## 2017-05-10 NOTE — Addendum Note (Signed)
Addendum  created 05/10/17 0902 by Clydell Alberts, MD   Sign clinical note    

## 2017-12-09 ENCOUNTER — Emergency Department (HOSPITAL_COMMUNITY)
Admission: EM | Admit: 2017-12-09 | Discharge: 2017-12-09 | Disposition: A | Discharge status: Home / Self Care | Payer: No Typology Code available for payment source | Attending: Emergency Medicine | Admitting: Emergency Medicine

## 2017-12-09 ENCOUNTER — Encounter (HOSPITAL_COMMUNITY): Payer: Self-pay

## 2017-12-09 ENCOUNTER — Other Ambulatory Visit: Payer: Self-pay

## 2017-12-09 ENCOUNTER — Emergency Department (HOSPITAL_COMMUNITY): Payer: No Typology Code available for payment source

## 2017-12-09 DIAGNOSIS — Y929 Unspecified place or not applicable: Secondary | ICD-10-CM | POA: Diagnosis not present

## 2017-12-09 DIAGNOSIS — M545 Low back pain, unspecified: Secondary | ICD-10-CM

## 2017-12-09 DIAGNOSIS — Y939 Activity, unspecified: Secondary | ICD-10-CM | POA: Insufficient documentation

## 2017-12-09 DIAGNOSIS — M62838 Other muscle spasm: Secondary | ICD-10-CM | POA: Diagnosis not present

## 2017-12-09 DIAGNOSIS — M25562 Pain in left knee: Secondary | ICD-10-CM | POA: Diagnosis not present

## 2017-12-09 DIAGNOSIS — M25561 Pain in right knee: Secondary | ICD-10-CM | POA: Insufficient documentation

## 2017-12-09 DIAGNOSIS — Y999 Unspecified external cause status: Secondary | ICD-10-CM | POA: Insufficient documentation

## 2017-12-09 MED ORDER — METHOCARBAMOL 750 MG PO TABS: 700.0000 mg | ORAL_TABLET | Freq: Three times a day (TID) | ORAL | 0 refills | Status: DC | PRN

## 2017-12-09 MED ORDER — IBUPROFEN 400 MG PO TABS
600.0000 mg | ORAL_TABLET | Freq: Once | ORAL | Status: AC
  Administered 2017-12-09: 600 mg via ORAL
  Filled 2017-12-09: qty 1
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
## 2017-12-09 NOTE — Discharge Instructions (Signed)
Please take Ibuprofen (Advil, motrin) and Tylenol (acetaminophen) to relieve your pain.  You may take up to 600 MG (3 pills) of normal strength ibuprofen every 8 hours as needed.  In between doses of ibuprofen you make take tylenol, up to 1,000 mg (two extra strength pills).  Do not take more than 3,000 mg tylenol in a 24 hour period.  Please check all medication labels as many medications such as pain and cold medications may contain tylenol.  Do not drink alcohol while taking these medications.  Do not take other NSAID'S while taking ibuprofen (such as aleve or naproxen).  Please take ibuprofen with food to decrease stomach upset. ° °The best way to get rid of muscle pain is by taking NSAIDS, using heat, massage therapy, and gentle stretching/range of motion exercises. ° °You are being prescribed a medication which may make you sleepy. For 24 hours after one dose please do not drive, operate heavy machinery, care for a small child with out another adult present, or perform any activities that may cause harm to you or someone else if you were to fall asleep or be impaired.  ° °

## 2018-01-07 ENCOUNTER — Ambulatory Visit: Payer: Self-pay | Admitting: Medical

## 2018-01-19 ENCOUNTER — Ambulatory Visit: Payer: 59 | Admitting: Medical

## 2018-01-19 ENCOUNTER — Encounter: Payer: Self-pay | Admitting: Medical

## 2018-01-19 VITALS — BP 112/70 | HR 92 | Temp 98.5°F | Ht 69.0 in | Wt 213.2 lb

## 2018-01-19 DIAGNOSIS — M6281 Muscle weakness (generalized): Secondary | ICD-10-CM | POA: Diagnosis not present

## 2018-01-19 DIAGNOSIS — R490 Dysphonia: Secondary | ICD-10-CM

## 2018-01-19 DIAGNOSIS — R682 Dry mouth, unspecified: Secondary | ICD-10-CM

## 2018-01-19 DIAGNOSIS — R6889 Other general symptoms and signs: Secondary | ICD-10-CM | POA: Diagnosis not present

## 2018-01-19 LAB — POCT URINALYSIS DIP (PROADVANTAGE DEVICE)
Bilirubin, UA: NEGATIVE
Blood, UA: NEGATIVE
Glucose, UA: NEGATIVE mg/dL
Ketones, POC UA: NEGATIVE mg/dL
Leukocytes, UA: NEGATIVE
Nitrite, UA: NEGATIVE
Specific Gravity, Urine: 1.03
Urobilinogen, Ur: NEGATIVE
pH, UA: 6 (ref 5.0–8.0)

## 2018-01-19 NOTE — Progress Notes (Signed)
Subjective: Chief Complaint  Patient presents with  . New Patient (Initial Visit)    congestion , weakness  x 1 year , foot pain x 1 week    Here for new patient to establish care  Had been going to the gym and lifting weights regularly before August, but had period of time when he couldn't go to the gym 2 weeks.  When he went back in the gym, tried to start back at where he left off.   However, he notes at that point having extreme weakness.   Gets some discomfort in muscles, but not much pain, just very weak.  August 2018 started feeling these symptoms.  Since then gets very weak with any physical activity. 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  He was overweight when living in [place], but since moving here to [place] in the past year, has lost 57 lb in the past year, tried to exercise hard.  Tongue seems dry a lot, gets a lot of mucous, feels like it chokes him.  Has to take little breaths due to mucous.  No SOB, no hemoptysis.  Does get some chest pains. No hx/o asthma.  No other aggravating or relieving factors. No other complaint.  History reviewed. No pertinent past medical history.  Current Outpatient Medications on File Prior to Visit  Medication Sig Dispense Refill  . Pseudoephedrine HCl (SUDAFED 12 HOUR PO) Take by mouth.    . methocarbamol (ROBAXIN) 750 MG tablet Take 1-2 tablets (750-1,500 mg total) by mouth every 8 (eight) hours as needed for muscle spasms. (Patient not taking: Reported on 01/19/2018) 20 tablet 0  . oxyCODONE (OXY IR/ROXICODONE) 5 MG immediate release tablet Take 1-2 tablets (5-10 mg total) by mouth every 4 (four) hours as needed for moderate pain. (Patient not taking: Reported on 01/19/2018) 15 tablet 0   No current facility-administered medications on file prior to visit.    Family History  Problem Relation Age of Onset  . Carpal tunnel syndrome Mother   . Diabetes  Father   . Cancer Paternal Aunt        breast  . Stroke Neg Hx   . Heart disease Neg Hx     ROS as in subjective    Objective: Blood pressure 112/70, pulse 92, temperature 98.5 F (36.9 C), height 5\' 9"  (1.753 m), weight 213 lb 3.2 oz (96.7 kg), SpO2 98 %.   General appearance: alert, no distress, WD/WN, AA male HEENT: normocephalic, sclerae anicteric, PERRLA, EOMi, nares patent, no discharge or erythema, pharynx normal Oral cavity: MMM, no lesions Neck: supple, no lymphadenopathy, no thyromegaly, no masses Heart: RRR, normal S1, S2, no murmurs Lungs: CTA bilaterally, no wheezes, rhonchi, or rales Back: non tender Musculoskeletal: arms and legs mildly tender to touch in general, otherwise nontender, no swelling, no obvious deformity Extremities: no edema, no cyanosis, no clubbing Pulses: 2+ symmetric, upper and lower extremities, normal cap refill Neurological: alert, oriented x 3, CN2-12 intact, strength normal upper extremities and lower extremities, sensation normal throughout, DTRs somewhat decreased, around 1+ throughout, no cerebellar signs, gait normal Psychiatric: normal affect, behavior normal, pleasant     Assessment: Encounter Diagnoses  Name Primary?  . Muscle weakness Yes  . Congestion of throat   . Dry mouth   . Hoarse     Plan We discussed his symptoms and concerns, possible causes although the list can be wide  at this point.  He has a several month history of generalized muscle weakness, hoarseness, dry mouth, without any significant family history or prior personal history other than marijuana use.  He does smoke marijuana at least twice a week.  Labs today, hydrate well, and pending labs consider other necessary evaluation.   Russell was seen today for new patient (initial visit).  Diagnoses and all orders for this visit:  Muscle weakness -     Comprehensive metabolic panel -     CBC with Differential/Platelet -     TSH -     CK -      Sedimentation rate  Congestion of throat -     Comprehensive metabolic panel -     CBC with Differential/Platelet -     TSH -     CK -     Sedimentation rate  Dry mouth -     Comprehensive metabolic panel -     CBC with Differential/Platelet -     TSH -     CK -     Sedimentation rate  Hoarse

## 2018-01-19 NOTE — Addendum Note (Signed)
Addended by: Winn JockVALENTINE, Kyri Dai N on: 01/19/2018 11:33 AM   Modules accepted: Orders

## 2018-01-20 LAB — CK: Total CK: 387 U/L — ABNORMAL HIGH (ref 24–204)

## 2018-01-20 LAB — CBC WITH DIFFERENTIAL/PLATELET
Basophils Absolute: 0 10*3/uL (ref 0.0–0.2)
Basos: 0 %
EOS (ABSOLUTE): 0 10*3/uL (ref 0.0–0.4)
Eos: 1 %
Hematocrit: 47.5 % (ref 37.5–51.0)
Hemoglobin: 15.2 g/dL (ref 13.0–17.7)
Immature Grans (Abs): 0 10*3/uL (ref 0.0–0.1)
Immature Granulocytes: 0 %
Lymphocytes Absolute: 1.6 10*3/uL (ref 0.7–3.1)
Lymphs: 30 %
MCH: 30 pg (ref 26.6–33.0)
MCHC: 32 g/dL (ref 31.5–35.7)
MCV: 94 fL (ref 79–97)
Monocytes Absolute: 0.7 10*3/uL (ref 0.1–0.9)
Monocytes: 12 %
Neutrophils Absolute: 3.1 10*3/uL (ref 1.4–7.0)
Neutrophils: 57 %
Platelets: 264 10*3/uL (ref 150–379)
RBC: 5.07 x10E6/uL (ref 4.14–5.80)
RDW: 13.7 % (ref 12.3–15.4)
WBC: 5.5 10*3/uL (ref 3.4–10.8)

## 2018-01-20 LAB — COMPREHENSIVE METABOLIC PANEL
ALT: 34 IU/L (ref 0–44)
AST: 23 IU/L (ref 0–40)
Albumin/Globulin Ratio: 1.6 (ref 1.2–2.2)
Albumin: 5 g/dL (ref 3.5–5.5)
Alkaline Phosphatase: 71 IU/L (ref 39–117)
BUN/Creatinine Ratio: 12 (ref 9–20)
BUN: 11 mg/dL (ref 6–20)
Bilirubin Total: 0.5 mg/dL (ref 0.0–1.2)
CO2: 23 mmol/L (ref 20–29)
Calcium: 9.6 mg/dL (ref 8.7–10.2)
Chloride: 104 mmol/L (ref 96–106)
Creatinine, Ser: 0.92 mg/dL (ref 0.76–1.27)
GFR calc Af Amer: 137 mL/min/{1.73_m2} (ref >59)
GFR calc non Af Amer: 118 mL/min/{1.73_m2} (ref >59)
Globulin, Total: 3.1 g/dL (ref 1.5–4.5)
Glucose: 87 mg/dL (ref 65–99)
Potassium: 4.3 mmol/L (ref 3.5–5.2)
Sodium: 141 mmol/L (ref 134–144)
Total Protein: 8.1 g/dL (ref 6.0–8.5)

## 2018-01-20 LAB — SEDIMENTATION RATE: Sed Rate: 17 mm/hr — ABNORMAL HIGH (ref 0–15)

## 2018-01-20 LAB — TSH: TSH: 0.729 u[IU]/mL (ref 0.450–4.500)

## 2018-02-04 ENCOUNTER — Ambulatory Visit: Payer: 59 | Admitting: Medical

## 2018-02-17 ENCOUNTER — Encounter: Payer: Self-pay | Admitting: Medical

## 2018-02-17 ENCOUNTER — Ambulatory Visit: Payer: 59 | Admitting: Medical

## 2018-02-17 VITALS — BP 114/76 | HR 87 | Wt 219.0 lb

## 2018-02-17 DIAGNOSIS — J0100 Acute maxillary sinusitis, unspecified: Secondary | ICD-10-CM | POA: Diagnosis not present

## 2018-02-17 DIAGNOSIS — R700 Elevated erythrocyte sedimentation rate: Secondary | ICD-10-CM | POA: Diagnosis not present

## 2018-02-17 DIAGNOSIS — J309 Allergic rhinitis, unspecified: Secondary | ICD-10-CM | POA: Diagnosis not present

## 2018-02-17 DIAGNOSIS — R748 Abnormal levels of other serum enzymes: Secondary | ICD-10-CM

## 2018-02-17 DIAGNOSIS — M6281 Muscle weakness (generalized): Secondary | ICD-10-CM | POA: Diagnosis not present

## 2018-02-17 MED ORDER — AMOXICILLIN 875 MG PO TABS: 875.0000 mg | ORAL_TABLET | Freq: Two times a day (BID) | ORAL | 0 refills | Status: DC

## 2018-02-17 MED ORDER — FLUTICASONE PROPIONATE 50 MCG/ACT NA SUSP
2.0000 | Freq: Every day | NASAL | 2 refills | Status: DC
Start: 2018-02-17 — End: 2019-06-22

## 2018-02-17 NOTE — Patient Instructions (Signed)
Encounter Diagnoses  Name Primary?  . Elevated CK Yes  . Elevated sed rate   . Muscle weakness   . Acute maxillary sinusitis, recurrence not specified   . Allergic rhinitis, unspecified seasonality, unspecified trigger     Recommendations:  We are referring you to a rheumatologist.  This is a specialist who deals in muscle and joint diseases.  Given your extreme muscle weakness and mildly abnormal blood test called the CK and sed rate, want to make sure there is nothing underlying going on   I recommend you drink plenty of water daily such a 64 ounces of water daily  Try and get good rest every night such as 7-8 hours of sleep per night  Avoid long periods of strenuous activity for now  You also have symptoms that suggest allergy problems and sinus infection  I recommend a short-term round of amoxicillin antibiotic, 1 tablet twice daily for 10 days  In general during the spring and fall I recommend you use Flonase nasal spray 1-2 sprays once daily in each nostril to reduce inflammation and to help with allergy symptoms     Creatine Kinase Test Why am I having this test? The creatine kinase (CK) test is performed to determine if there has been damage to muscle tissue in your body. This test can be used to help diagnose heart attack, neurologic diseases, or skeletal diseases. Three different forms of CK are present in your body. They are referred to as isoenzymes:  CK-MM is found in your skeletal muscles and heart.  CK-MB is found mostly in your heart.  CK-BB is found mostly in your brain.  What kind of sample is taken? A blood sample is required for this test. It is usually collected by inserting a needle into a vein. How do I prepare for this test? There is no preparation required for this test. Be aware that your health care provider may require blood samples to be taken at regular intervals for up to 1 week. What are the reference ranges? Reference ranges are  considered healthy ranges established after testing a large group of healthy people. Reference ranges may vary among different people, labs, and hospitals. It is your responsibility to obtain your test results. Ask the lab or department performing the test when and how you will get your results. The reference ranges for the CK test are as follows: Total CK:  Adult or elderly (values are higher after exercise): ? Male: 55-170 units/L or 55-170 units/L (SI units). ? Male: 30-135 units/L or 30-135 units/L (SI units).  Newborn: 68-580 units/L (SI units). Isoenzymes:  CK-MM: 100%.  CK-MB: 0%.  CK-BB: 0%. What do the results mean? Levels of total CK that are above the reference ranges may indicate injury or diseases affecting the heart, skeletal muscle, or brain. Increased levels of CK-MM isoenzyme may indicate:  Certain diseases affecting the skeletal muscle.  Recent surgery, trauma, or injury.  Conditions that cause convulsions.  Increased levels of CK-MB isoenzyme may indicate:  Recent heart attack.  Other conditions that cause injury to the heart muscle.  Increased levels of CK-BB isoenzyme may indicate:  Diseases that affect the central nervous system.  Certain psychiatric therapies.  Certain types of cancer.  Injury to the lungs.  Talk with your health care provider to discuss your results, treatment options, and if necessary, the need for more tests. Talk with your health care provider if you have any questions about your results. Talk with your health care provider  to discuss your results, treatment options, and if necessary, the need for more tests. Talk with your health care provider if you have any questions about your results. This information is not intended to replace advice given to you by your health care provider. Make sure you discuss any questions you have with your health care provider. Document Released: 12/26/2004 Document Revised: 07/30/2016 Document  Reviewed: 04/21/2014 Elsevier Interactive Patient Education  [employer]2018 [employer]vier Inc.

## 2018-02-17 NOTE — Progress Notes (Signed)
Subjective: Chief Complaint  Patient presents with  . Follow-up    follow up  for muscle weakness   At his new patient visit in February he complained of muscle weakness and mucous congestion.   He had been going to the gym and lifting weights regularly before August, but had period of time when he couldn't go to the gym 2 weeks.  When he went back in the gym, tried to start back at where he left off.   However, he notes at that point having extreme weakness.   Gets some discomfort in muscles, but not much pain, just very weak.  August 2018 started feeling these symptoms.  Since then gets very weak with any physical activity. 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  He was overweight when living in [place], but since moving here to [place] in the past year, has lost 57 lb in the past year, tried to exercise hard.  Elbows and shoulders ache at times.  No joint swelling  Tongue seems dry a lot, gets a lot of mucous, feels like it chokes him.  Has to take little breaths due to mucous.  No SOB, no hemoptysis.  Does get some chest pains. No hx/o asthma.  No other aggravating or relieving factors. No other complaint.  No past medical history on file.  Current Outpatient Medications on File Prior to Visit  Medication Sig Dispense Refill  . Pseudoephedrine HCl (SUDAFED 12 HOUR PO) Take by mouth.    . methocarbamol (ROBAXIN) 750 MG tablet Take 1-2 tablets (750-1,500 mg total) by mouth every 8 (eight) hours as needed for muscle spasms. (Patient not taking: Reported on 01/19/2018) 20 tablet 0  . oxyCODONE (OXY IR/ROXICODONE) 5 MG immediate release tablet Take 1-2 tablets (5-10 mg total) by mouth every 4 (four) hours as needed for moderate pain. (Patient not taking: Reported on 01/19/2018) 15 tablet 0   No current facility-administered medications on file prior to visit.    Family History  Problem Relation Age of  Onset  . Carpal tunnel syndrome Mother   . Diabetes Father   . Cancer Paternal Aunt        breast  . Stroke Neg Hx   . Heart disease Neg Hx     ROS as in subjective    Objective: Blood pressure 114/76, pulse 87, weight 219 lb (99.3 kg), SpO2 97 %.  Wt Readings from Last 3 Encounters:  02/17/18 219 lb (99.3 kg)  01/19/18 213 lb 3.2 oz (96.7 kg)  01/10/17 250 lb (113.4 kg)   General appearance: alert, no distress, WD/WN, AA male HEENT: normocephalic, sclerae anicteric, PERRLA, EOMi, nares with turbinate edema, mucoid discharge, mild erythema, pharynx normal Oral cavity: MMM, no lesions Neck: supple, no lymphadenopathy, no thyromegaly, no masses Heart: RRR, normal S1, S2, no murmurs Lungs: CTA bilaterally, no wheezes, rhonchi, or rales Back: non tender Musculoskeletal: arms and legs mildly tender to touch in general, otherwise nontender, no swelling, no obvious deformity Extremities: no edema, no cyanosis, no clubbing Pulses: 2+ symmetric, upper and lower extremities, normal cap refill Neurological: alert, oriented x 3, CN2-12 intact, strength normal upper extremities and lower extremities, sensation normal throughout, DTRs somewhat decreased, around 1+ throughout, no cerebellar signs, gait normal Psychiatric: normal affect, behavior normal, pleasant     Assessment: Encounter Diagnoses  Name Primary?  . Elevated CK Yes  . Elevated sed rate   .  Muscle weakness   . Acute maxillary sinusitis, recurrence not specified   . Allergic rhinitis, unspecified seasonality, unspecified trigger       Plan Labs from 01/19/18 show normal comprehensive metabolic panel, trace protein in the urine, CBC normal, TSH normal, and CK was mildly elevated at 387, sed rate marker mildly elevated at 17.  Recommendations:  We are referring you to a rheumatologist.  This is a specialist who deals in muscle and joint diseases.  Given your extreme muscle weakness and mildly abnormal blood test  called the CK and sed rate, want to make sure there is nothing underlying going on   I recommend you drink plenty of water daily such a 64 ounces of water daily  Try and get good rest every night such as 7-8 hours of sleep per night  Avoid long periods of strenuous activity for now  You also have symptoms that suggest allergy problems and sinus infection  I recommend a short-term round of amoxicillin antibiotic, 1 tablet twice daily for 10 days  In general during the spring and fall I recommend you use Flonase nasal spray 1-2 sprays once daily in each nostril to reduce inflammation and to help with allergy symptoms  Millie was seen today for follow-up.  Diagnoses and all orders for this visit:  Elevated CK -     Ambulatory referral to Rheumatology  Elevated sed rate -     Ambulatory referral to Rheumatology  Muscle weakness -     Ambulatory referral to Rheumatology  Acute maxillary sinusitis, recurrence not specified  Allergic rhinitis, unspecified seasonality, unspecified trigger  Other orders -     amoxicillin (AMOXIL) 875 MG tablet; Take 1 tablet (875 mg total) by mouth 2 (two) times daily. -     fluticasone (FLONASE) 50 MCG/ACT nasal spray; Place 2 sprays into both nostrils daily.

## 2018-05-22 ENCOUNTER — Encounter (HOSPITAL_COMMUNITY): Payer: Self-pay | Admitting: Emergency Medicine

## 2018-05-22 ENCOUNTER — Ambulatory Visit (HOSPITAL_COMMUNITY)
Admission: EM | Admit: 2018-05-22 | Discharge: 2018-05-22 | Disposition: A | Discharge status: Home / Self Care | Payer: 59 | Attending: Family Medicine | Admitting: Family Medicine

## 2018-05-22 ENCOUNTER — Ambulatory Visit (INDEPENDENT_AMBULATORY_CARE_PROVIDER_SITE_OTHER): Payer: 59

## 2018-05-22 DIAGNOSIS — S62609A Fracture of unspecified phalanx of unspecified finger, initial encounter for closed fracture: Secondary | ICD-10-CM

## 2018-05-22 DIAGNOSIS — M7989 Other specified soft tissue disorders: Secondary | ICD-10-CM | POA: Diagnosis not present

## 2018-05-22 MED ORDER — NAPROXEN 375 MG PO TABS: 375.0000 mg | ORAL_TABLET | Freq: Two times a day (BID) | ORAL | 0 refills | Status: DC | PRN

## 2018-05-22 NOTE — Discharge Instructions (Addendum)
Thumb spica brace given Please follow up with ortho for further evaluation and management Naprosyn prescribed Rest, ice, and wear brace Return or go to the ER if you have any new or worsening symptoms

## 2018-05-22 NOTE — ED Triage Notes (Signed)
Pt sts pain in right thumb after injuring

## 2018-05-22 NOTE — ED Provider Notes (Addendum)
[facility]   [id number] 05/22/18 Arrival Time: 1348  SUBJECTIVE: History from: patient. Dylan [name]. is a 22 y.o. male complains of right thumb deformity that began this morning.  Denies a precipitating event or specific injury, but states he had an injury a couple months ago while playing basketball.  Was asymptomatic at that time.  Localizes his symptoms to the right thumb.  Denies thumb pain.  Denies aggravating or alleviating factors.  Denies similar symptoms in the past.  Denies fever, chills, erythema, ecchymosis, effusion, weakness, numbness and tingling.      ROS: As per HPI.  History reviewed. No pertinent past medical history. Past Surgical History:  Procedure Laterality Date  . APPENDECTOMY    . LAPAROSCOPIC APPENDECTOMY N/A 01/10/2017   Procedure: LAPAROSCOPIC APPENDECTOMY;  Surgeon: Gaynelle Adu, MD;  Location: [facility];  Service: General;  Laterality: N/A;   No Known Allergies No current facility-administered medications on file prior to encounter.    Current Outpatient Medications on File Prior to Encounter  Medication Sig Dispense Refill  . amoxicillin (AMOXIL) 875 MG tablet Take 1 tablet (875 mg total) by mouth 2 (two) times daily. 20 tablet 0  . fluticasone (FLONASE) 50 MCG/ACT nasal spray Place 2 sprays into both nostrils daily. 16 g 2  . methocarbamol (ROBAXIN) 750 MG tablet Take 1-2 tablets (750-1,500 mg total) by mouth every 8 (eight) hours as needed for muscle spasms. (Patient not taking: Reported on 01/19/2018) 20 tablet 0  . oxyCODONE (OXY IR/ROXICODONE) 5 MG immediate release tablet Take 1-2 tablets (5-10 mg total) by mouth every 4 (four) hours as needed for moderate pain. (Patient not taking: Reported on 01/19/2018) 15 tablet 0  . Pseudoephedrine HCl (SUDAFED 12 HOUR PO) Take by mouth.     Social History   Socioeconomic History  . Marital status: Single    Spouse name: Not on file  . Number of children: Not on file  . Years of education:  Not on file  . Highest education level: Not on file  Occupational History  . Not on file  Social Needs  . Financial resource strain: Not on file  . Food insecurity:    Worry: Not on file    Inability: Not on file  . Transportation needs:    Medical: Not on file    Non-medical: Not on file  Tobacco Use  . Smoking status: Never Smoker  . Smokeless tobacco: Never Used  Substance and Sexual Activity  . Alcohol use: No    Frequency: Never  . Drug use: Yes    Types: Marijuana  . Sexual activity: Not on file  Lifestyle  . Physical activity:    Days per week: Not on file    Minutes per session: Not on file  . Stress: Not on file  Relationships  . Social connections:    Talks on phone: Not on file    Gets together: Not on file    Attends religious service: Not on file    Active member of club or organization: Not on file    Attends meetings of clubs or organizations: Not on file    Relationship status: Not on file  . Intimate partner violence:    Fear of current or ex partner: Not on file    Emotionally abused: Not on file    Physically abused: Not on file    Forced sexual activity: Not on file  Other Topics Concern  . Not on file  Social History Narrative  .  Not on file   Family History  Problem Relation Age of Onset  . Carpal tunnel syndrome Mother   . Diabetes Father   . Cancer Paternal Aunt        breast  . Stroke Neg Hx   . Heart disease Neg Hx     OBJECTIVE:  Vitals:   05/22/18 1409  BP: 126/76  Pulse: 82  Resp: 18  Temp: 99 F (37.2 C)  TempSrc: Oral  SpO2: 100%    General appearance: AOx3; in no acute distress.  Head: NCAT Lungs: CTA bilaterally Heart: RRR.  Clear S1 and S2 without murmur, gallops, or rubs.  Radial pulses 2+ bilaterally. Musculoskeletal: Right hand Inspection: Skin warm, dry, clear and intact without obvious erythema, effusion, or ecchymosis. Right hand first finger deformity about the MCP joint Palpation: Nontender to  palpation ROM: FROM active and passive Strength: 5/5 grip strength Skin: warm and dry Neurologic: Ambulates without difficulty; Sensation intact Psychological: alert and cooperative; agitated mood  DIAGNOSTIC STUDIES:  CLINICAL DATA: Prior injury few months ago. New onset swelling.  EXAM: RIGHT THUMB 2+V  COMPARISON: No recent.  FINDINGS: Mild subluxation of the first metacarpal phalangeal joint cannot be excluded. Slight deformity noted at the base of the proximal phalanx of the right thumb. This may be a site of old injury. No clearcut acute fracture lucency noted. Diffuse soft tissue swelling. No radiopaque foreign body.  IMPRESSION: 1. Mild subluxation of the first metacarpal phalangeal joint cannot be excluded. Slight deformity noted the base of the proximal phalanx of the right thumb. This may be from old injury. No clear-cut acute fracture lucency identified.  2. Soft tissue swelling. No radiopaque foreign body.   Electronically Signed By: Maisie Fushomas Register On: 05/22/2018 14:32  I have reviewed the x-rays and the radiologist interpretation. I am in agreement with the radiologist interpretation.     ASSESSMENT & PLAN:  1. Closed fracture subluxation of finger, initial encounter     Meds ordered this encounter  Medications  . naproxen (NAPROSYN) 375 MG tablet    Sig: Take 1 tablet (375 mg total) by mouth 2 (two) times daily as needed for moderate pain.    Dispense:  20 tablet    Refill:  0    Order Specific Question:   Supervising Provider    Answer:   Isa RankinMURRAY, LAURA WILSON [[id number]]   Thumb spica brace given Please follow up with ortho for further evaluation and management Naprosyn prescribed Rest, ice, and wear brace Return or go to the ER if you have any new or worsening symptoms  Patient upset that we were unable to "fix" his thumb permanently today and states he wasted $50 coming here today.  I discussed with the patient that because the  injury occurred 2 months ago, there is nothing we could do today, other than stabilizing the thumb with a thumb spica brace and have him follow up with orthopedist for further evaluation and treatment.  Patient was unsatisfied with the outcome of today's visit  Reviewed expectations re: course of current medical issues. Questions answered. Outlined signs and symptoms indicating need for more acute intervention. Patient verbalized understanding. After Visit Summary given.    Rennis HardingWurst, Fisher Hargadon, PA-C 05/22/18 1534    Alvino ChapelWurst, Rose[place]Brittany, PA-C 05/22/18 1536

## 2018-05-26 ENCOUNTER — Ambulatory Visit (INDEPENDENT_AMBULATORY_CARE_PROVIDER_SITE_OTHER): Payer: 59 | Admitting: Orthopaedic Surgery

## 2018-05-26 DIAGNOSIS — M79644 Pain in right finger(s): Secondary | ICD-10-CM | POA: Diagnosis not present

## 2018-05-26 NOTE — Progress Notes (Signed)
Office Visit Note  Patient: Dylan Niannthony Darnell Ramus Jr.           Date of Birth: 10/02/1996           MRN: [id number] Visit Date: 05/26/2018              Requested by: Jac Canavanysinger, David S, PA-C [address] [place], [place] [id number] PCP: Jac Canavanysinger, David S, PA-C   Assessment & Plan: Visit Diagnoses:  1. Pain of right thumb     Plan: Impression is right thumb radial collateral ligament injury.  Based on findings I recommend referral to Dr. Betha LoaKevin Kuzma for further evaluation and treatment and possible need for surgery.  Patient became upset due to the fact that he was going to have to see another surgeon for this condition so he stormed out of the exam room and I was unable to complete my instructions with him.  I had to finish the encounter with his significant other. follow-up with me as needed.    Follow-Up Instructions: Return if symptoms worsen or fail to improve.   Orders:  Orders Placed This Encounter  Procedures  . Ambulatory referral to Orthopedic Surgery   No orders of the defined types were placed in this encounter.     Procedures: No procedures performed   Clinical Data: No additional findings.   Subjective: Chief Complaint  Patient presents with  . Right Thumb - Pain    Patient is a 22 year old right-hand-dominant comes in for right thumb pain that he injured about a month ago while playing basketball.  He does not recall exactly how his thumb was injured.  He has had continued pain and swelling and occasional numbness in the thumb region.  He was evaluated at the urgent care and was told to follow-up with an orthopedic office.   Review of Systems  Constitutional: Negative.   All other systems reviewed and are negative.    Objective: Vital Signs: There were no vitals taken for this visit.  Physical Exam  Constitutional: He is oriented to person, place, and time. He appears well-developed and well-nourished.  HENT:  Head: Normocephalic and  atraumatic.  Eyes: Pupils are equal, round, and reactive to light.  Neck: Neck supple.  Pulmonary/Chest: Effort normal.  Abdominal: Soft.  Musculoskeletal: Normal range of motion.  Neurological: He is alert and oriented to person, place, and time.  Skin: Skin is warm.  Psychiatric: He has a normal mood and affect. His behavior is normal. Judgment and thought content normal.  Nursing note and vitals reviewed.   Ortho Exam Right hand and thumb exam shows tenderness of the radial collateral ligament with guarding.  There is laxity of the radial collateral ligament compared to the contralateral side.  He has discomfort in limitation with IP and MP joint flexion.  No neurovascular compromise. Specialty Comments:  No specialty comments available.  Imaging: No results found.   PMFS History: Patient Active Problem List   Diagnosis Date Noted  . Pain of right thumb 05/26/2018  . Elevated CK 02/17/2018  . Elevated sed rate 02/17/2018  . Allergic rhinitis 02/17/2018  . Acute maxillary sinusitis 02/17/2018  . Muscle weakness 01/19/2018  . Dry mouth 01/19/2018  . Congestion of throat 01/19/2018  . Hoarse 01/19/2018   No past medical history on file.  Family History  Problem Relation Age of Onset  . Carpal tunnel syndrome Mother   . Diabetes Father   . Cancer Paternal Aunt        breast  .  Stroke Neg Hx   . Heart disease Neg Hx     Past Surgical History:  Procedure Laterality Date  . APPENDECTOMY    . LAPAROSCOPIC APPENDECTOMY N/A 01/10/2017   Procedure: LAPAROSCOPIC APPENDECTOMY;  Surgeon: Gaynelle Adu, MD;  Location: [facility] OR;  Service: General;  Laterality: N/A;   Social History   Occupational History  . Not on file  Tobacco Use  . Smoking status: Never Smoker  . Smokeless tobacco: Never Used  Substance and Sexual Activity  . Alcohol use: No    Frequency: Never  . Drug use: Yes    Types: Marijuana  . Sexual activity: Not on file

## 2018-06-09 DIAGNOSIS — S5321XA Traumatic rupture of right radial collateral ligament, initial encounter: Secondary | ICD-10-CM | POA: Diagnosis not present

## 2018-06-16 ENCOUNTER — Other Ambulatory Visit: Payer: Self-pay | Admitting: Orthopedic Surgery

## 2018-06-16 DIAGNOSIS — S5321XA Traumatic rupture of right radial collateral ligament, initial encounter: Secondary | ICD-10-CM

## 2018-06-16 DIAGNOSIS — S63642A Sprain of metacarpophalangeal joint of left thumb, initial encounter: Secondary | ICD-10-CM

## 2018-06-16 DIAGNOSIS — S5322XA Traumatic rupture of left radial collateral ligament, initial encounter: Secondary | ICD-10-CM

## 2018-06-16 DIAGNOSIS — S63641A Sprain of metacarpophalangeal joint of right thumb, initial encounter: Secondary | ICD-10-CM

## 2018-07-13 ENCOUNTER — Ambulatory Visit
Admission: RE | Admit: 2018-07-13 | Discharge: 2018-07-13 | Disposition: A | Discharge status: Home / Self Care | Payer: No Typology Code available for payment source | Source: Ambulatory Visit | Attending: Orthopedic Surgery | Admitting: Orthopedic Surgery

## 2018-07-13 ENCOUNTER — Ambulatory Visit
Admission: RE | Admit: 2018-07-13 | Discharge: 2018-07-13 | Disposition: A | Discharge status: Home / Self Care | Payer: 59 | Source: Ambulatory Visit | Attending: Orthopedic Surgery | Admitting: Orthopedic Surgery

## 2018-07-13 DIAGNOSIS — S5321XA Traumatic rupture of right radial collateral ligament, initial encounter: Secondary | ICD-10-CM

## 2018-07-13 DIAGNOSIS — S63641A Sprain of metacarpophalangeal joint of right thumb, initial encounter: Secondary | ICD-10-CM

## 2018-07-13 MED ORDER — IOPAMIDOL (ISOVUE-M 200) INJECTION 41%
1.5000 mL | Freq: Once | INTRAMUSCULAR | Status: AC
  Administered 2018-07-13: 1.5 mL via INTRA_ARTICULAR

## 2019-04-25 ENCOUNTER — Other Ambulatory Visit: Payer: Self-pay

## 2019-04-25 ENCOUNTER — Emergency Department (HOSPITAL_COMMUNITY)
Admission: EM | Admit: 2019-04-25 | Discharge: 2019-04-25 | Disposition: A | Discharge status: Home / Self Care | Payer: Managed Care, Other (non HMO) | Attending: Emergency Medicine | Admitting: Emergency Medicine

## 2019-04-25 ENCOUNTER — Encounter (HOSPITAL_COMMUNITY): Payer: Self-pay

## 2019-04-25 ENCOUNTER — Emergency Department (HOSPITAL_COMMUNITY): Payer: Managed Care, Other (non HMO)

## 2019-04-25 DIAGNOSIS — R10817 Generalized abdominal tenderness: Secondary | ICD-10-CM | POA: Insufficient documentation

## 2019-04-25 DIAGNOSIS — R112 Nausea with vomiting, unspecified: Secondary | ICD-10-CM | POA: Insufficient documentation

## 2019-04-25 DIAGNOSIS — N201 Calculus of ureter: Secondary | ICD-10-CM | POA: Insufficient documentation

## 2019-04-25 DIAGNOSIS — N200 Calculus of kidney: Secondary | ICD-10-CM

## 2019-04-25 DIAGNOSIS — R1032 Left lower quadrant pain: Secondary | ICD-10-CM | POA: Diagnosis present

## 2019-04-25 LAB — COMPREHENSIVE METABOLIC PANEL
ALT: 48 U/L — ABNORMAL HIGH (ref 0–44)
AST: 31 U/L (ref 15–41)
Albumin: 4.5 g/dL (ref 3.5–5.0)
Alkaline Phosphatase: 53 U/L (ref 38–126)
Anion gap: 9 (ref 5–15)
BUN: 9 mg/dL (ref 6–20)
CO2: 23 mmol/L (ref 22–32)
Calcium: 9.4 mg/dL (ref 8.9–10.3)
Chloride: 107 mmol/L (ref 98–111)
Creatinine, Ser: 1.01 mg/dL (ref 0.61–1.24)
GFR calc Af Amer: >60 mL/min (ref >60)
GFR calc non Af Amer: >60 mL/min (ref >60)
Glucose, Bld: 109 mg/dL — ABNORMAL HIGH (ref 70–99)
Potassium: 3.8 mmol/L (ref 3.5–5.1)
Sodium: 139 mmol/L (ref 135–145)
Total Bilirubin: 0.8 mg/dL (ref 0.3–1.2)
Total Protein: 8.2 g/dL — ABNORMAL HIGH (ref 6.5–8.1)

## 2019-04-25 LAB — CBC WITH DIFFERENTIAL/PLATELET
Abs Immature Granulocytes: 0.03 10*3/uL (ref 0.00–0.07)
Basophils Absolute: 0 10*3/uL (ref 0.0–0.1)
Basophils Relative: 0 %
Eosinophils Absolute: 0 10*3/uL (ref 0.0–0.5)
Eosinophils Relative: 1 %
HCT: 47.6 % (ref 39.0–52.0)
Hemoglobin: 15.4 g/dL (ref 13.0–17.0)
Immature Granulocytes: 1 %
Lymphocytes Relative: 31 %
Lymphs Abs: 1.8 10*3/uL (ref 0.7–4.0)
MCH: 30 pg (ref 26.0–34.0)
MCHC: 32.4 g/dL (ref 30.0–36.0)
MCV: 92.6 fL (ref 80.0–100.0)
Monocytes Absolute: 0.8 10*3/uL (ref 0.1–1.0)
Monocytes Relative: 13 %
Neutro Abs: 3.1 10*3/uL (ref 1.7–7.7)
Neutrophils Relative %: 54 %
Platelets: 245 10*3/uL (ref 150–400)
RBC: 5.14 MIL/uL (ref 4.22–5.81)
RDW: 12 % (ref 11.5–15.5)
WBC: 5.8 10*3/uL (ref 4.0–10.5)
nRBC: 0 % (ref 0.0–0.2)

## 2019-04-25 LAB — URINALYSIS, ROUTINE W REFLEX MICROSCOPIC
Bacteria, UA: NONE SEEN
Bilirubin Urine: NEGATIVE
Glucose, UA: NEGATIVE mg/dL
Ketones, ur: NEGATIVE mg/dL
Leukocytes,Ua: NEGATIVE
Nitrite: NEGATIVE
Protein, ur: NEGATIVE mg/dL
Specific Gravity, Urine: 1.028 (ref 1.005–1.030)
pH: 8 (ref 5.0–8.0)

## 2019-04-25 LAB — LIPASE, BLOOD: Lipase: 23 U/L (ref 11–51)

## 2019-04-25 MED ORDER — ONDANSETRON HCL 4 MG PO TABS: 4.0000 mg | ORAL_TABLET | Freq: Four times a day (QID) | ORAL | 0 refills | Status: DC

## 2019-04-25 MED ORDER — ONDANSETRON HCL 4 MG/2ML IJ SOLN
4.0000 mg | Freq: Once | INTRAMUSCULAR | Status: AC
  Administered 2019-04-25: 09:00:00 4 mg via INTRAVENOUS
  Filled 2019-04-25: qty 2

## 2019-04-25 MED ORDER — KETOROLAC TROMETHAMINE 30 MG/ML IJ SOLN
30.0000 mg | Freq: Once | INTRAMUSCULAR | Status: AC
  Administered 2019-04-25: 11:00:00 30 mg via INTRAVENOUS
  Filled 2019-04-25: qty 1

## 2019-04-25 MED ORDER — HYDROMORPHONE HCL 1 MG/ML IJ SOLN
1.0000 mg | Freq: Once | INTRAMUSCULAR | Status: AC
  Administered 2019-04-25: 10:00:00 1 mg via INTRAVENOUS
  Filled 2019-04-25: qty 1

## 2019-04-25 MED ORDER — TAMSULOSIN HCL 0.4 MG PO CAPS: 0.4000 mg | ORAL_CAPSULE | Freq: Every day | ORAL | 0 refills | Status: DC

## 2019-04-25 MED ORDER — HYDROCODONE-ACETAMINOPHEN 5-325 MG PO TABS: 1.0000 | ORAL_TABLET | Freq: Four times a day (QID) | ORAL | 0 refills | Status: AC | PRN

## 2019-04-25 MED ORDER — HYDROCODONE-ACETAMINOPHEN 5-325 MG PO TABS
1.0000 | ORAL_TABLET | Freq: Once | ORAL | Status: AC
  Administered 2019-04-25: 14:00:00 1 via ORAL
  Filled 2019-04-25: qty 1

## 2019-04-25 MED ORDER — SODIUM CHLORIDE 0.9 % IV BOLUS
1000.0000 mL | Freq: Once | INTRAVENOUS | Status: AC
  Administered 2019-04-25: 09:00:00 1000 mL via INTRAVENOUS

## 2019-04-25 MED ORDER — MORPHINE SULFATE (PF) 4 MG/ML IV SOLN
4.0000 mg | Freq: Once | INTRAVENOUS | Status: AC
  Administered 2019-04-25: 4 mg via INTRAVENOUS
  Filled 2019-04-25: qty 1

## 2019-04-25 MED ORDER — IOHEXOL 300 MG/ML  SOLN
100.0000 mL | Freq: Once | INTRAMUSCULAR | Status: AC | PRN
  Administered 2019-04-25: 100 mL via INTRAVENOUS

## 2019-04-25 MED ORDER — HYDROMORPHONE HCL 1 MG/ML IJ SOLN
1.0000 mg | Freq: Once | INTRAMUSCULAR | Status: DC
  Filled 2019-04-25: qty 1

## 2019-04-25 MED ORDER — ONDANSETRON HCL 4 MG/2ML IJ SOLN
4.0000 mg | Freq: Once | INTRAMUSCULAR | Status: AC
  Administered 2019-04-25: 4 mg via INTRAVENOUS
  Filled 2019-04-25: qty 2

## 2019-04-25 NOTE — ED Triage Notes (Signed)
PT DC home . Pt reports the friend of his girl friend is coming to pick him up. Pt waiting in the lobby for ride. t instructed to wait in lobby for ride.  Pt voices understanding.

## 2019-04-25 NOTE — ED Triage Notes (Signed)
Upon entering Pt room  This writer was unable to wake Pt by calling his name 3 times.  This Clinical research associate requested PA to come to room to witness pt . Upon entering room Pa called pt name twice and PT responded to say he was trying to sleep. Pt reported he did not hear this writer call his name.   Plan to DC pt home . Pt instructed to call family for a ride. Will give pt po pain med at dc.

## 2019-04-25 NOTE — ED Notes (Signed)
Patient verbalizes understanding of discharge instructions . Opportunity for questions and answers were provided . Armband removed by staff ,Pt discharged from ED. W/C  offered at D/C  and Declined W/C at D/C and was escorted to lobby by RN.  

## 2019-04-25 NOTE — Discharge Instructions (Addendum)
Evaluated today for abdominal pain. You have a kidney stone. Please take the medications as prescribed. Follow up with Urology if you have continued symptoms. Their number is listed on your discharge paperwork. Call them for an appointment.  Return to the ED for any new or worsening symptoms.

## 2019-04-25 NOTE — ED Triage Notes (Signed)
Pt stated that he was laying down when his LLQ began having an ongoing stabbing pain rated 10/10, pt endorses n/v, & informs the only abd surgery in his Hx is appendectomy.

## 2019-04-25 NOTE — ED Provider Notes (Addendum)
[facility] Provider [facility]   CSN: [id number] Arrival date & time: 04/25/19  [id number]  History   Chief Complaint Chief Complaint  Patient presents with   Abdominal Pain   HPI Dylan Niannthony Darnell Huguley Jr. is a 23 y.o. male with past medical history significant for laparoscopic appendectomy (2018) who presents for evaluation of abdominal pain.  Patient has had abdominal pain x1 hour.  Pain diffuse generalized in nature, however worse to suprapubic and left lower quadrant.  Describes the pain as aching and sharp.  He has had 2 episodes of nonbloody, nonbilious emesis.  He has not had any episodes of diarrhea or constipation.  He cannot remember his last bowel movement, however states it could have been this morning yesterday evening.  He denies flank pain or radiation of pain into his groin.  Denies prior history of kidney stones. He rates his current pain a 10/10. Denies radiation of pain. Has not taken anything for his symptoms. Sx have been constant since onset.  Denies fever, chills, chest pain, shortness of breath, dysuria, constipation, diarrhea, melena, hematochezia, hematemesis, testicular pain, penile discharge, penile pain, urinary symptoms.  No prior history of diverticulitis or diverticulosis.  Last PO intake: Prior ABD surgeries: Lap Appendectomy 2018- [facility], Dr. Andrey CampanileWilson    History obtain from patient. No interpretor was used.     HPI  History reviewed. No pertinent past medical history.  Patient Active Problem List   Diagnosis Date Noted   Pain of right thumb 05/26/2018   Elevated CK 02/17/2018   Elevated sed rate 02/17/2018   Allergic rhinitis 02/17/2018   Acute maxillary sinusitis 02/17/2018   Muscle weakness 01/19/2018   Dry mouth 01/19/2018   Congestion of throat 01/19/2018   Hoarse 01/19/2018    Past Surgical History:  Procedure Laterality Date   APPENDECTOMY     LAPAROSCOPIC APPENDECTOMY N/A 01/10/2017   Procedure:  LAPAROSCOPIC APPENDECTOMY;  Surgeon: Gaynelle AduEric Wilson, MD;  [facility];  Service: General;  Laterality: N/A;        Home Medications    Prior to Admission medications   Medication Sig Start Date End Date Taking? Authorizing Provider  cetirizine (ZYRTEC) 10 MG chewable tablet Chew 10 mg by mouth daily.   Yes [provider]  pseudoephedrine (SUDAFED 12 HOUR) 120 MG 12 hr tablet Take 120 mg by mouth every 12 (twelve) hours as needed (congestion).    Yes [provider]  amoxicillin (AMOXIL) 875 MG tablet Take 1 tablet (875 mg total) by mouth 2 (two) times daily. Patient not taking: Reported on 04/25/2019 02/17/18   Tysinger, Kermit Baloavid S, PA-C  fluticasone [facility](FLONASE) 50 MCG/ACT nasal spray Place 2 sprays into both nostrils daily. Patient not taking: Reported on 04/25/2019 02/17/18   Tysinger, Kermit Baloavid S, PA-C  HYDROcodone-acetaminophen (NORCO/VICODIN) 5-325 MG tablet Take 1-2 tablets by mouth every 6 (six) hours as needed for up to 3 days for severe pain. 04/25/19 04/28/19  Clovia Reine A, PA-C  methocarbamol (ROBAXIN) 750 MG tablet Take 1-2 tablets (750-1,500 mg total) by mouth every 8 (eight) hours as needed for muscle spasms. Patient not taking: Reported on 01/19/2018 12/09/17   Cristina GongHammond, Elizabeth W, PA-C  naproxen (NAPROSYN) 375 MG tablet Take 1 tablet (375 mg total) by mouth 2 (two) times daily as needed for moderate pain. Patient not taking: Reported on 04/25/2019 05/22/18   Wurst, GrenadaBrittany, PA-C  ondansetron (ZOFRAN) 4 MG tablet Take 1 tablet (4 mg total) by mouth every 6 (six) hours. 04/25/19  Roshaun Pound A, PA-C  oxyCODONE (OXY IR/ROXICODONE) 5 MG immediate release tablet Take 1-2 tablets (5-10 mg total) by mouth every 4 (four) hours as needed for moderate pain. Patient not taking: Reported on 01/19/2018 01/11/17   Romie Levee, MD  tamsulosin [facility]) 0.4 MG CAPS capsule Take 1 capsule (0.4 mg total) by mouth daily after breakfast. 04/25/19   Ghislaine Harcum A, PA-C    Family  History Family History  Problem Relation Age of Onset   Carpal tunnel syndrome Mother    Diabetes Father    Cancer Paternal Aunt        breast   Stroke Neg Hx    Heart disease Neg Hx     Social History Social History   Tobacco Use   Smoking status: Never Smoker   Smokeless tobacco: Never Used  Substance Use Topics   Alcohol use: No    Frequency: Never   Drug use: Yes    Types: Marijuana     Allergies   Patient has no known allergies.   Review of Systems Review of Systems  Constitutional: Negative.   HENT: Negative.   Respiratory: Negative.   Cardiovascular: Negative.   Gastrointestinal: Positive for abdominal pain, nausea and vomiting. Negative for abdominal distention, anal bleeding, blood in stool, constipation, diarrhea and rectal pain.  Genitourinary: Negative.   Musculoskeletal: Negative.   Skin: Negative.   Neurological: Negative.   All other systems reviewed and are negative.    Physical Exam Updated Vital Signs BP 129/82 (BP Location: Right Arm)    Pulse 82    Temp 98.2 F (36.8 C) (Oral)    Resp 20    SpO2 98%   Physical Exam Vitals signs and nursing note reviewed. Exam conducted with a chaperone present.  Constitutional:      General: He is not in acute distress.    Appearance: He is well-developed. He is not ill-appearing, toxic-appearing or diaphoretic.     Comments: Patient writhing around in bed tossing and turning on initial evaluation.  Appears in pain.  HENT:     Head: Normocephalic and atraumatic.     Mouth/Throat:     Mouth: Mucous membranes are moist.     Comments: Oropharynx clear.  Mucous membranes moist.  Uvula midline without deviation. Eyes:     Pupils: Pupils are equal, round, and reactive to light.  Neck:     Musculoskeletal: Normal range of motion and neck supple.  Cardiovascular:     Rate and Rhythm: Normal rate and regular rhythm.     Heart sounds: Normal heart sounds.     Comments: No murmurs, rubs or  gallops. Pulmonary:     Effort: Pulmonary effort is normal. No respiratory distress.     Comments: Clear to auscultation bilateral without wheeze, rhonchi or rales.  No accessory muscle usage.  Speaks in full sentences without difficulty. Abdominal:     General: There is no distension.     Palpations: Abdomen is soft.     Comments: Diffuse tenderness palpation to entire abd, worse to suprapubic, left lower quadrant and periumbilical area.  He has old well-healed laparoscopic scars from prior appendectomy.  He has hyperactive bowel sounds.  He has negative CVA tenderness.  He has mild guarding without rebound.  He has a negative McBurney point.  He has no obvious abdominal wall herniations.  No abdominal wall skin changes.  Genitourinary:    Pubic Area: No rash or pubic lice.      Penis: Normal.  Scrotum/Testes: Normal. Cremasteric reflex is present.     Epididymis:     Right: Normal.     Left: Normal.     Comments: Testes without tenderness. Cremasteric reflex present bilaterally. No inguinal lymphadenopathy or inguinal hernias. No penile discharge or tenderness over epididymis. Musculoskeletal: Normal range of motion.     Comments: Moves all 4 extremities without difficulty.  Skin:    General: Skin is warm and dry.     Comments: Brisk capillary refill.  No rashes or lesions.  Neurological:     Mental Status: He is alert.      ED Treatments / Results  Labs (all labs ordered are listed, but only abnormal results are displayed) Labs Reviewed  COMPREHENSIVE METABOLIC PANEL - Abnormal; Notable for the following components:      Result Value   Glucose, Bld 109 (*)    Total Protein 8.2 (*)    ALT 48 (*)    All other components within normal limits  URINALYSIS, ROUTINE W REFLEX MICROSCOPIC - Abnormal; Notable for the following components:   Color, Urine STRAW (*)    Hgb urine dipstick SMALL (*)    All other components within normal limits  CBC WITH DIFFERENTIAL/PLATELET   LIPASE, BLOOD    EKG None  Radiology Ct Abdomen Pelvis W Contrast  Result Date: 04/25/2019 CLINICAL DATA:  History of left lower quadrant abdominal pain for the past 2-3 hours. Difficulty urinating. History of appendectomy. EXAM: CT ABDOMEN AND PELVIS WITH CONTRAST TECHNIQUE: Multidetector CT imaging of the abdomen and pelvis was performed using the standard protocol following bolus administration of intravenous contrast. CONTRAST:  OMNIPAQUE IOHEXOL 300 MG/ML  SOLN COMPARISON:  CT abdomen pelvis-01/10/2017 FINDINGS: Lower chest: Limited visualization of the lower thorax is negative for focal airspace opacity or pleural effusion. Normal heart size.  No pericardial effusion. Hepatobiliary: Normal hepatic contour. No discrete hepatic lesions. Normal appearance of the gallbladder. No radiopaque gallstones. No intra extrahepatic bili duct dilatation. No ascites. Pancreas: Normal appearance of the pancreas. Spleen: Normal appearance of the spleen. Note is made of a small splenule. Adrenals/Urinary Tract: There is a punctate (approximately 0.3 cm) stone within the superior aspect of the left ureter (image 48, series 3; coronal image 60, series 5) which is associated with mild upstream ureterectasis, pelvicaliectasis and slightly delayed perfusion of the left kidney in comparison to the right. No additional renal stones are seen on this postcontrast examination. No evidence of right-sided urinary obstruction. No discrete renal lesions. Normal appearance of the bilateral adrenal glands. Normal appearance of the urinary bladder given degree distention. Stomach/Bowel: The sigmoid colon is noted to be redundant. Moderate colonic stool burden without evidence of enteric obstruction. Normal appearance of the terminal ileum. Post appendectomy. No pneumoperitoneum, pneumatosis or portal venous gas. Vascular/Lymphatic: Normal caliber the abdominal aorta. The major branch vessels of the abdominal aorta appear patent  on this non CTA examination. Scattered mesenteric lymph nodes are numerous though individually not enlarged by size criteria. No bulky retroperitoneal, mesenteric, pelvic or inguinal lymphadenopathy. Reproductive: Normal appearance the prostate gland. No free fluid in the pelvic cul-de-sac. Other: There is a minimal amount of subcutaneous edema about the midline of the low back. Musculoskeletal: No acute or aggressive osseous abnormalities. IMPRESSION: 1. Punctate (approximately 0.3 cm) stone within the superior aspect of the left ureter results in mild upstream ureterectasis, pelvicaliectasis and slightly delayed perfusion of the left kidney in comparison to the right. 2. No additional renal stones are identified. No evidence of right-sided  urinary obstruction. Electronically Signed   By: Simonne Come M.D.   On: 04/25/2019 10:25    Procedures Procedures (including critical care time)  Medications Ordered in ED Medications  HYDROcodone-acetaminophen (NORCO/VICODIN) 5-325 MG per tablet 1 tablet (has no administration in time range)  morphine 4 MG/ML injection 4 mg (4 mg Intravenous Given 04/25/19 0852)  ondansetron (ZOFRAN) injection 4 mg (4 mg Intravenous Given 04/25/19 0854)  sodium chloride 0.9 % bolus 1,000 mL (0 mLs Intravenous Stopped 04/25/19 1050)  HYDROmorphone (DILAUDID) injection 1 mg (1 mg Intravenous Given 04/25/19 0945)  iohexol (OMNIPAQUE) 300 MG/ML solution 100 mL (100 mLs Intravenous Contrast Given 04/25/19 1008)  ketorolac (TORADOL) 30 MG/ML injection 30 mg (30 mg Intravenous Given 04/25/19 1050)  ondansetron (ZOFRAN) injection 4 mg (4 mg Intravenous Given 04/25/19 1328)     Initial Impression / Assessment and Plan / ED Course  I have reviewed the triage vital signs and the nursing notes.  Pertinent labs & imaging results that were available during my care of the patient were reviewed by me and considered in my medical decision making (see chart for details).  23 year old male  appears otherwise well presents for evaluation abdominal pain. Afebrile, nonseptic, non-ill-appearing. Patient with 1 hour of diffuse suprapubic and left lower quadrant abdominal tenderness as well as diffuse abdominal tenderness.  He has some guarding without rebound.  He has negative McBurney point.  He has no urinary symptoms.  He does have prior history of laparoscopic appendectomy in 2018.  He has had 2 episodes of nonbloody, nonbilious emesis.  He has not had diarrhea or constipation, however cannot remember his last bowel movement. He denies hematemesis, melena or hematochezia.  Heart and lungs clear.  Denies prior history of kidney stones.  He has no flank pain no radiation of pain to his groin or testes.  He is hypertensive on his initial triage evaluation, however I believe this is probably due to patient's pain.  Will obtain labs, urine, imaging, provide pain medicine, antiemetics, IV fluids and reevaluate.  DDX: Testicular torsion, STD, Diverticulitis, bowel obstruction/bowel perforation, Hernia, pyelonephritis, abscess, Colitis, mesenteric ischemia, gastritis, cystitis, stump appendicitis, MSK, splenic infarct/spleen laceration  Imaging and Labs personally reviewed: CBC: Without leukocytosis, hemoglobin 15.4 Lipase: 23 CMP: Mild elevation in ALT at 48, total protein 8.2, mild hyperglycemia at 109, no additional electrolyte, renal or liver abnormality. Urinalysis: CT AP: With punctate 65mm stone in left ureter.  1115: On reevalaution patient sleeping soundly.  Arousable by voice.  Patient continues to endorse 8/10 pain, however he did see Toradol approximately 15 minutes ago.  He appears physically to be in less pain as he is able to sleep at this time compared to his thrashing of pain when he first arrived.  Reevaluate.  1215: Patient sleeping comfortably. Urinalysis pending. Re-evaluation Abd soft non-tender without rebound or guarding. Non surgical abdomen.  1300: Urinalysis negative for  infection  Tolerating PO intake in ED without difficulty. Endorses 5/10 pain however patient sleeping on multiple reevaluations and appear comfortable. Will dc home with pain medication, Flomax and Urology follow up. Urinalysis negative for infection, low suspicion for infected stone at this time.  1330: Nursing has notified me that patient with emesis and recurrent pain at dc. Patient would like additional dose of pain medication at this time. Discussed inpatient management. Patient would like trial of additional dose of pain medication before he decides on inpatient vs outpatient mangement.  1400: Placed orders for additional dose of Dilaudid.  Nursing has notified  me that patient sleeping when she went to give additional pain medicine.  She called patient's name 3 times and he was unable to be woken up. I stepped into the room and had to shake patient to wake him up. Shared discission making with patient, do not feel at this time patient needs additional IV pain medicine. He has been sleeping soundly does not appear to be in distress the last 3 times I have evaluated patient. He has since tolerated PO since given additional Zofran. Drinking 2 cups of water. He has only had 1 episode of NBNB emesis throughout his ED stay. Will have patient follow up with Urology and dc home with Norco, Zofran and flomax. Dicussed return to the ED if his symptoms are unable to be controlled with PO medication at home or intractable emesis.  Patient is nontoxic, nonseptic appearing, in no apparent distress.  Patient's pain and other symptoms adequately managed in emergency department.  Fluid bolus given.  Labs, imaging and vitals reviewed.  Patient does not meet the SIRS or Sepsis criteria.  On repeat exam patient does not have a surgical abdomin and there are no peritoneal signs.  No indication of appendicitis, bowel obstruction, bowel perforation, cholecystitis, diverticulitis, testicular torsion, abscess, strangulated/  incarcerated hernia, infectious process, spleen injury.    Patient is hemodynamically stable and in no acute distress.  Patient able to ambulate in department prior to ED.  Evaluation does not show acute pathology that would require ongoing or additional emergent interventions while in the emergency department or further inpatient treatment.  I have discussed the diagnosis with the patient and answered all questions.  Pain is been managed while in the emergency department and patient has no further complaints prior to discharge.  Patient is comfortable with plan discussed in room and is stable for discharge at this time.  I have discussed strict return precautions for returning to the emergency department.  Patient was encouraged to follow-up with PCP/specialist refer to at discharge.  Dylan [name]. was evaluated in Emergency Department on 04/26/2019 for the symptoms described in the history of present illness. He was evaluated in the context of the global COVID-19 pandemic, which necessitated consideration that the patient might be at risk for infection with the SARS-CoV-2 virus that causes COVID-19. Institutional protocols and algorithms that pertain to the evaluation of patients at risk for COVID-19 are in a state of rapid change based on information released by regulatory bodies including the CDC and federal and state organizations. These policies and algorithms were followed during the patient's care in the ED.     Final Clinical Impressions(s) / ED Diagnoses   Final diagnoses:  Nephrolithiasis    ED Discharge Orders         Ordered    HYDROcodone-acetaminophen (NORCO/VICODIN) 5-325 MG tablet  Every 6 hours PRN     04/25/19 1309    tamsulosin (FLOMAX) 0.4 MG CAPS capsule  Daily after breakfast     04/25/19 1309    ondansetron (ZOFRAN) 4 MG tablet  Every 6 hours     04/25/19 1309           Crestina Strike A, PA-C 04/25/19 1313    Latonia Conrow A, PA-C 04/25/19  1406    Tegeler, Canary Brim, MD 04/25/19 1605    Ferry Matthis A, PA-C 04/26/19 1044    Tegeler, Canary Brim, MD 04/26/19 [phone]

## 2019-04-25 NOTE — ED Notes (Signed)
Patient transported to CT 

## 2019-04-25 NOTE — ED Notes (Signed)
EDP at bedside  

## 2019-04-25 NOTE — ED Triage Notes (Signed)
PT vomiting at time of DC.  Nausea med given.

## 2019-06-22 ENCOUNTER — Other Ambulatory Visit: Payer: Self-pay

## 2019-06-22 ENCOUNTER — Encounter: Payer: Self-pay | Admitting: Medical

## 2019-06-22 ENCOUNTER — Ambulatory Visit (INDEPENDENT_AMBULATORY_CARE_PROVIDER_SITE_OTHER): Payer: Managed Care, Other (non HMO) | Admitting: Medical

## 2019-06-22 VITALS — BP 122/72 | HR 98 | Temp 98.7°F | Ht 69.0 in | Wt 247.6 lb

## 2019-06-22 DIAGNOSIS — W19XXXA Unspecified fall, initial encounter: Secondary | ICD-10-CM | POA: Insufficient documentation

## 2019-06-22 DIAGNOSIS — W19XXXD Unspecified fall, subsequent encounter: Secondary | ICD-10-CM

## 2019-06-22 DIAGNOSIS — R748 Abnormal levels of other serum enzymes: Secondary | ICD-10-CM | POA: Diagnosis not present

## 2019-06-22 DIAGNOSIS — M6281 Muscle weakness (generalized): Secondary | ICD-10-CM

## 2019-06-22 NOTE — Progress Notes (Signed)
Subjective: Chief Complaint  Patient presents with  . Extremity Weakness    having falls from legs giving out    Here for concerns about weakness.  I saw him last year for the same concern.  Last year we planned to refer him to a specialist but he never went due to concerns about co-pay cost.  He continues to have the same concerns though.  He notes weakness in arms and legs, feeling heavy at times.  Having trouble holding things with arms,  having trouble pulling up pants.  Feeling weakness in legs going up stairs.  Muscles feel sore or heavy or tired at times.  Been feeling this for 2 years since 07/2017.  He denies numbness or tingling but just feels weak in his muscles in general.  He has had several falls.  He says he follows probably 7-8 times per month.  He works for [employer] as a Education officer, community.  He is worried because he is afraid to go up and down stairs.  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  He denies joint swelling, no rash, no nail or hair changes.  No family history of neuromuscular disease.  No history of MS in the family.  He drinks no alcohol.  He does continue to use marijuana twice weekly.  He is a non-smoker.  Because of the weakness he has not been able to exercise and has gained weight chest pain, edema. no other aggravating or relieving factors. No other complaint.   No past medical history on file.  No current outpatient medications on file prior to visit.   No current facility-administered medications on file prior to visit.    Family History  Problem Relation Age of Onset  . Carpal tunnel syndrome Mother   . Diabetes Father   . Cancer Paternal Aunt        breast  . Stroke Neg Hx   . Heart disease Neg Hx    ROS as in subjective    Objective: BP 122/72   Pulse 98   Temp 98.7 F (37.1 C)   Ht 5\' 9"  (1.753 m)   Wt 247 lb 9.6 oz (112.3 kg)   SpO2 95%   BMI 36.56 kg/m   Wt Readings from Last 3 Encounters:  06/22/19 247 lb  9.6 oz (112.3 kg)  02/17/18 219 lb (99.3 kg)  01/19/18 213 lb 3.2 oz (96.7 kg)    General appearance: alert, no distress, WD/WN, AA male HEENT: normocephalic, sclerae anicteric, PERRLA, EOMi, nares patent, no discharge or erythema, pharynx normal Oral cavity: MMM, no lesions Neck: supple, no lymphadenopathy, no thyromegaly, no masses, no bruits Heart: RRR, normal S1, S2, no murmurs Lungs: CTA bilaterally, no wheezes, rhonchi, or rales back: non tender Musculoskeletal: nontender, no swelling, no obvious deformity Extremities: no edema, no cyanosis, no clubbing Pulses: 2+ symmetric, upper and lower extremities, normal cap refill Neurological: alert, oriented x 3, CN2-12 intact,strength seems to be relatively normal or slight decreased in general upper extremities and lower extremities but symmetrical, no obvious focal findings, sensation normal throughout, DTRs 1-2+ throughout, no cerebellar signs, gait normal Psychiatric: normal affect, behavior normal, pleasant     Assessment: Encounter Diagnoses  Name Primary?  . Muscle weakness Yes  . Fall, subsequent encounter   . Elevated CK     Plan: We discussed his concerns.  I reviewed his visit from last year for basically the same complaint.  At that time he had a  very slightly elevated CK level and slightly elevated sed rate.  He has no obvious joint swelling or joint complaint is mostly a muscle weakness complaint.  I will refer him to neurology.  Advised that he needs to keep this appointment and that there would be a higher co-pay since this is a specialist.  I reviewed recent labs from May from a hospital encounter.  There were no significant findings been on labs.  I advised she hydrate well with water, continue to eat a variety of fruits and vegetables.  I advised he not smoke marijuana as this does not help his cause.  Dylan Brownsnthony was seen today for extremity weakness.  Diagnoses and all orders for this visit:  Muscle weakness -      Ambulatory referral to Neurology  Fall, subsequent encounter -     Ambulatory referral to Neurology  Elevated CK -     Ambulatory referral to Neurology

## 2019-07-08 ENCOUNTER — Ambulatory Visit: Payer: Managed Care, Other (non HMO) | Admitting: Neurology

## 2019-07-08 ENCOUNTER — Telehealth: Payer: Self-pay | Admitting: *Deleted

## 2019-07-08 NOTE — Telephone Encounter (Signed)
Patient was no show for new patient appointment today. 

## 2019-07-13 ENCOUNTER — Encounter: Payer: Self-pay | Admitting: Neurology

## 2019-12-28 NOTE — Progress Notes (Signed)
[facility]    Provider:  Dr Jaynee Eagles Requesting Provider: Glade Lloyd Camelia Eng, PA-C Primary Care Provider:  Carlena Hurl, PA-C  CC:  Muscle weakness  HPI:  Dylan [name]. is a 24 y.o. male here as requested by Carlena Hurl, PA-C for muscle weakness, fall, elevated CK.  Patient has a past medical history of muscle weakness, elevated CK, elevated sed rate and fall.  Patient with weakness, I reviewed Dr. Madelyn Brunner notes, he was seen last year for the same concern, he was referred to a specialist but he never went due to concerns of cost, he continues to have the same symptoms, weakness in the arms and legs feeling heavy to time, having trouble holding things up with arms, trauma pulling up pants, feeling weakness in the legs going up stairs, muscles frequently feeling heavy or tired, been feeling this for 2 years since August 2018.  No numbness or tingling. CK 387 01/19/2018.   He says he feels weakness, walking, walking up steps, lifting up arms, trying to squeeze, buckling pants even feels hard, his hand get tight, he lose feeling in fingers, he has weakness of fingers extensors. Started 2 years ago, no inciting events but he noticed he went back to the gym after a few months and it never was the same, felt sore, muscle soreness. Can't hold arms up. He feels it is stable not really worsening. No vision changes. No muscle wasting. No weight loss. Mild neck tightness/discomfort and low back pain but no radicular symptoms. No Fhx of muscular dystrophy or other neuromuscular disorders, aunt has Lupus. No alcohol. Marijuana use 4-5 a week ("not often") but no other drugs. No OTC supplements. No recent head or neck injuries. He feels he has some problems with cheeks or smiling blowing weakness. No other focal neurologic deficits, associated symptoms, inciting events or modifiable factors.  Reviewed notes, labs and imaging from outside physicians, which showed:  See above  for review  Review of Systems: Patient complains of symptoms per HPI as well as the following symptoms: muscle weakness. Pertinent negatives and positives per HPI. All others negative.   Social History   Socioeconomic History   Marital status: Single    Spouse name: Not on file   Number of children: 1   Years of education: Not on file   Highest education level: Some college, no degree  Occupational History   Not on file  Tobacco Use   Smoking status: Never Smoker   Smokeless tobacco: Never Used  Substance and Sexual Activity   Alcohol use: No   Drug use: Yes    Types: Marijuana    Comment: 4-5 use/week   Sexual activity: Not on file  Other Topics Concern   Not on file  Social History Narrative   Lives at home with his girlfriend and daughter   Right handed   Caffeine: 2 cans/day of soda off and on   Social Determinants of Health   Financial Resource Strain:    Difficulty of Paying Living Expenses: Not on file  Food Insecurity:    Worried About Charity fundraiser in the Last Year: Not on file   [employer] of Food in the Last Year: Not on file  Transportation Needs:    Lack of Transportation (Medical): Not on file   Lack of Transportation (Non-Medical): Not on file  Physical Activity:    Days of Exercise per Week: Not on file   Minutes of Exercise per Session: Not  on file  Stress:    Feeling of Stress : Not on file  Social Connections:    Frequency of Communication with Friends and Family: Not on file   Frequency of Social Gatherings with Friends and Family: Not on file   Attends Religious Services: Not on file   Active Member of Clubs or Organizations: Not on file   Attends Archivist Meetings: Not on file   Marital Status: Not on file  Intimate Partner Violence:    Fear of Current or Ex-Partner: Not on file   Emotionally Abused: Not on file   Physically Abused: Not on file   Sexually Abused: Not on file    Family  History  Problem Relation Age of Onset   Carpal tunnel syndrome Mother    Diabetes Father    Cancer Paternal Aunt        breast   Stroke Neg Hx    Heart disease Neg Hx    Muscular dystrophy Neg Hx     Past Medical History:  Diagnosis Date   HyperCKemia     Patient Active Problem List   Diagnosis Date Noted   HyperCKemia    Fall 06/22/2019   Pain of right thumb 05/26/2018   Elevated CK 02/17/2018   Elevated sed rate 02/17/2018   Allergic rhinitis 02/17/2018   Acute maxillary sinusitis 02/17/2018   Muscle weakness 01/19/2018   Dry mouth 01/19/2018   Congestion of throat 01/19/2018   Hoarse 01/19/2018    Past Surgical History:  Procedure Laterality Date   APPENDECTOMY     LAPAROSCOPIC APPENDECTOMY N/A 01/10/2017   Procedure: LAPAROSCOPIC APPENDECTOMY;  Surgeon: Greer Pickerel, MD;  Location: [facility];  Service: General;  Laterality: N/A;    No current outpatient medications on file.   No current facility-administered medications for this visit.    Allergies as of 12/29/2019   (No Known Allergies)    Vitals: BP 116/71 (BP Location: Right Arm, Patient Position: Sitting)    Pulse 71    Temp 97.6 F (36.4 C) Comment: taken at front   Ht '5\' 9"'  (1.753 m)    Wt 244 lb (110.7 kg)    BMI 36.03 kg/m  Last Weight:  Wt Readings from Last 1 Encounters:  12/29/19 244 lb (110.7 kg)   Last Height:   Ht Readings from Last 1 Encounters:  12/29/19 '5\' 9"'  (1.753 m)     Physical exam: Exam: Gen: NAD, conversant, well nourised, obese, well groomed                     CV: RRR, no MRG. No Carotid Bruits. No peripheral edema, warm, nontender Eyes: Conjunctivae clear without exudates or hemorrhage  Neuro: Detailed Neurologic Exam  Speech:    Speech is normal; fluent and spontaneous with normal comprehension.  Cognition:    The patient is oriented to person, place, and time;     recent and remote memory intact;     language fluent;     normal attention,  concentration,     fund of knowledge Cranial Nerves:    The pupils are equal, round, and reactive to light. The fundi are normal and spontaneous venous pulsations are present. Visual fields are full to finger confrontation. Extraocular movements are intact. Trigeminal sensation is intact and the muscles of mastication are normal. Slight left lid retraction vs slight right llid drooping, slight weakness cheek puff, otherwise the face is symmetric, good eye and mouth closure and tongue protrusion. The  palate elevates in the midline. Hearing intact. Voice is normal. Shoulder shrug is normal. The tongue has normal motion without fasciculations.   Coordination:    Normal finger to nose and heel to shin.    Gait:    Heel-toe and tandem gait are normal.   Motor Observation:    No asymmetry, no atrophy, and no involuntary movements noted. Tone:    Normal muscle tone.    Posture:    Posture is normal. normal erect    Strength: bilateral deltoids 4+/5, bilateral hip flexion 3+/5    Strength is V/V in the upper and lower limbs.      Sensation: intact to LT     Reflex Exam: no percussion myotonia, grip relaxation normal  DTR's:    Deep tendon reflexes in the upper and lower extremities are normal bilaterally.   Toes:    The toes are downgoing bilaterally.   Clonus:    Clonus is absent.    Assessment/Plan: This is a 24 year old patient with several years of muscle weakness slowly progressive.  Examination shows proximal muscle weakness in the upper and lower extremities and does report difficulty lifting arms overhead and climbing stairs, it is unclear to me whether he may have facial weakness or not with some slightly asymmetric eyelids and possibly slight weakness on puffing out cheeks but otherwise face is symmetric and strength is intact with good eye closure and eyebrow raise.  He has good distal strength, he will toe and tandem gait are normal, normal muscle tone, reflexes are normal,  there is no clonus or Babinski, he reports some muscle cramping in the hands but no percussion myotonia and grip relaxation is normal, no sensory changes.  No family history of muscular dystrophy or other neuromuscular disorders but one aunt has lupus.  At this point there is a wide differential and although this appears to be a neuromuscular disorder we cannot rule out central lesions in the brain or the cervical spinal cord although this is much less likely given clinical exam and history.  We will perform an EMG nerve conduction study.  I ordered extensive lab work today.  I will also order an MRI of the brain and the cervical spine but advised patient if it financially does not make sense we can hold off until EMG nerve conduction study but I would want to make sure there is no multiple sclerosis or other demyelinating or intracranial or intraspinal etiologies. Elevated CK.   Emg/ncs right arm and right leg Extensive labwork MRI brain and cervical spine  Orders Placed This Encounter  Procedures   MR BRAIN W WO CONTRAST   MR CERVICAL SPINE W WO CONTRAST   CK   CBC   Comprehensive metabolic panel   Acetylcholine receptor, binding   Acetylcholine receptor, blocking   Acetylcholine receptor, modulating   Angiotensin converting enzyme   Magnesium   TSH   HIV Antibody (routine testing w rflx)   Sedimentation rate   B12 and Folate Panel   Sjogren's syndrome antibods(ssa + ssb)   RPR   Hepatitis C antibody   Rheumatoid factor   Heavy metals, blood   Vitamin B6   Vitamin B1   Methylmalonic acid, serum   Hemoglobin A1c   Lactic acid, plasma   Aldolase   Multiple Myeloma Panel (SPEP&IFE w/QIG)   No orders of the defined types were placed in this encounter.   Cc: Tysinger, Camelia Eng, PA-C,    Sarina Ill, MD  [facility]  [address] [place], [place] [address]  Phone [phone] Fax [phone]

## 2019-12-29 ENCOUNTER — Other Ambulatory Visit: Payer: Self-pay

## 2019-12-29 ENCOUNTER — Encounter: Payer: Self-pay | Admitting: Neurology

## 2019-12-29 ENCOUNTER — Ambulatory Visit: Payer: Managed Care, Other (non HMO) | Admitting: Neurology

## 2019-12-29 VITALS — BP 116/71 | HR 71 | Temp 97.6°F | Ht 69.0 in | Wt 244.0 lb

## 2019-12-29 DIAGNOSIS — G510 Bell's palsy: Secondary | ICD-10-CM

## 2019-12-29 DIAGNOSIS — G709 Myoneural disorder, unspecified: Secondary | ICD-10-CM | POA: Diagnosis not present

## 2019-12-29 DIAGNOSIS — R29810 Facial weakness: Secondary | ICD-10-CM

## 2019-12-29 DIAGNOSIS — W19XXXA Unspecified fall, initial encounter: Secondary | ICD-10-CM

## 2019-12-29 DIAGNOSIS — M6281 Muscle weakness (generalized): Secondary | ICD-10-CM | POA: Diagnosis not present

## 2019-12-29 DIAGNOSIS — R29818 Other symptoms and signs involving the nervous system: Secondary | ICD-10-CM

## 2019-12-29 DIAGNOSIS — R748 Abnormal levels of other serum enzymes: Secondary | ICD-10-CM

## 2019-12-29 NOTE — Patient Instructions (Signed)
bloodwork MRI brain and cervical spine EMG/NCS - see below   Electromyoneurogram Electromyoneurogram is a test to check how well your muscles and nerves are working. This procedure includes the combined use of electromyogram (EMG) and nerve conduction study (NCS). EMG is used to look for muscular disorders. NCS, which is also called electroneurogram, measures how well your nerves are controlling your muscles. The procedures are usually done together to check if your muscles and nerves are healthy. If the results of the tests are abnormal, this may indicate disease or injury, such as a neuromuscular disease or peripheral nerve damage. Tell a health care provider about:  Any allergies you have.  All medicines you are taking, including vitamins, herbs, eye drops, creams, and over-the-counter medicines.  Any problems you or family members have had with anesthetic medicines.  Any blood disorders you have.  Any surgeries you have had.  Any medical conditions you have.  If you have a pacemaker.  Whether you are pregnant or may be pregnant. What are the risks? Generally, this is a safe procedure. However, problems may occur, including:  Infection where the electrodes were inserted.  Bleeding. What happens before the procedure? Medicines Ask your health care provider about:  Changing or stopping your regular medicines. This is especially important if you are taking diabetes medicines or blood thinners.  Taking medicines such as aspirin and ibuprofen. These medicines can thin your blood. Do not take these medicines unless your health care provider tells you to take them.  Taking over-the-counter medicines, vitamins, herbs, and supplements. General instructions  Your health care provider may ask you to avoid: ? Beverages that have caffeine, such as coffee and tea. ? Any products that contain nicotine or tobacco. These products include cigarettes, e-cigarettes, and chewing tobacco. If  you need help quitting, ask your health care provider.  Do not use lotions or creams on the same day that you will be having the procedure. What happens during the procedure? For EMG   Your health care provider will ask you to stay in a position so that he or she can access the muscle that will be studied. You may be standing, sitting, or lying down.  You may be given a medicine that numbs the area (local anesthetic).  A very thin needle that has an electrode will be inserted into your muscle.  Another small electrode will be placed on your skin near the muscle.  Your health care provider will ask you to continue to remain still.  The electrodes will send a signal that tells about the electrical activity of your muscles. You may see this on a monitor or hear it in the room.  After your muscles have been studied at rest, your health care provider will ask you to contract or flex your muscles. The electrodes will send a signal that tells about the electrical activity of your muscles.  Your health care provider will remove the electrodes and the electrode needles when the procedure is finished. The procedure may vary among health care providers and hospitals. For NCS   An electrode that records your nerve activity (recording electrode) will be placed on your skin by the muscle that is being studied.  An electrode that is used as a reference (reference electrode) will be placed near the recording electrode.  A paste or gel will be applied to your skin between the recording electrode and the reference electrode.  Your nerve will be stimulated with a mild shock. Your health care provider  will measure how much time it takes for your muscle to react.  Your health care provider will remove the electrodes and the gel when the procedure is finished. The procedure may vary among health care providers and hospitals. What happens after the procedure?  It is up to you to get the results of  your procedure. Ask your health care provider, or the department that is doing the procedure, when your results will be ready.  Your health care provider may: ? Give you medicines for any pain. ? Monitor the insertion sites to make sure that bleeding stops. Summary  Electromyoneurogram is a test to check how well your muscles and nerves are working.  If the results of the tests are abnormal, this may indicate disease or injury.  This is a safe procedure. However, problems may occur, such as bleeding and infection.  Your health care provider will do two tests to complete this procedure. One checks your muscles (EMG) and another checks your nerves (NCS).  It is up to you to get the results of your procedure. Ask your health care provider, or the department that is doing the procedure, when your results will be ready. This information is not intended to replace advice given to you by your health care provider. Make sure you discuss any questions you have with your health care provider. Document Revised: 08/11/2018 Document Reviewed: 07/24/2018 Elsevier Patient Education  2020 [employer].

## 2019-12-31 NOTE — Progress Notes (Signed)
Your CK is elevated again, still awaiting more labs to come back and we can discuss at your appointment next week thanks Dr. Lucia Gaskins

## 2020-01-03 ENCOUNTER — Telehealth: Payer: Self-pay | Admitting: *Deleted

## 2020-01-03 ENCOUNTER — Telehealth: Payer: Self-pay | Admitting: Neurology

## 2020-01-03 NOTE — Telephone Encounter (Signed)
Left message requesting a return call from the patient.

## 2020-01-03 NOTE — Progress Notes (Signed)
Patient's myasthenia gravis labs came back positive, I have spoken to Dr. Terrace Arabia who agrees to transfer and treat patient. Please let patient know given this lab we no longer need the emg/ncs and I am pretty sure I mentioned myasthenia gravis during our appointment. Please schedule with Dr. Terrace Arabia please. Thank you

## 2020-01-03 NOTE — Telephone Encounter (Signed)
-----   Message from Anson Fret, MD sent at 01/03/2020  9:18 AM EST ----- Patient's myasthenia gravis labs came back positive, I have spoken to Dr. Terrace Arabia who agrees to transfer and treat patient. Please let patient know given this lab we no longer need the emg/ncs and I am pretty sure I mentioned myasthenia gravis during our appointment. Please schedule with Dr. Terrace Arabia please. Thank you

## 2020-01-03 NOTE — Telephone Encounter (Signed)
Disregard previous message, if patient calls back please inform him that per Dr. Lucia Gaskins, test is not needed.

## 2020-01-03 NOTE — Telephone Encounter (Signed)
I called patient and LVM to reschedule 1/28 NCV/EMG due to tech being out. Requested patient call back to reschedule.

## 2020-01-03 NOTE — Telephone Encounter (Signed)
I spoke to the patient and provided him with the information below.  He has been added to Dr. Zannie Cove schedule on 04/05/2020 at 4pm.  He will arrive early for check-in.

## 2020-01-06 ENCOUNTER — Ambulatory Visit: Payer: Managed Care, Other (non HMO) | Admitting: Neurology

## 2020-01-06 ENCOUNTER — Telehealth: Payer: Self-pay

## 2020-01-06 ENCOUNTER — Other Ambulatory Visit: Payer: Self-pay

## 2020-01-06 ENCOUNTER — Encounter: Payer: Managed Care, Other (non HMO) | Admitting: Neurology

## 2020-01-06 ENCOUNTER — Encounter: Payer: Self-pay | Admitting: Neurology

## 2020-01-06 VITALS — BP 135/85 | HR 91 | Temp 97.7°F | Ht 69.0 in | Wt 244.0 lb

## 2020-01-06 DIAGNOSIS — G709 Myoneural disorder, unspecified: Secondary | ICD-10-CM | POA: Diagnosis not present

## 2020-01-06 DIAGNOSIS — G7000 Myasthenia gravis without (acute) exacerbation: Secondary | ICD-10-CM | POA: Insufficient documentation

## 2020-01-06 DIAGNOSIS — R531 Weakness: Secondary | ICD-10-CM | POA: Diagnosis not present

## 2020-01-06 MED ORDER — FAMOTIDINE 10 MG PO TABS: 10.0000 mg | ORAL_TABLET | Freq: Two times a day (BID) | ORAL | 11 refills | Status: DC | PRN

## 2020-01-06 MED ORDER — PYRIDOSTIGMINE BROMIDE 60 MG PO TABS: 60.0000 mg | ORAL_TABLET | Freq: Three times a day (TID) | ORAL | 11 refills | Status: DC

## 2020-01-06 MED ORDER — PREDNISONE 10 MG PO TABS: ORAL_TABLET | ORAL | 3 refills | Status: DC

## 2020-01-06 NOTE — Telephone Encounter (Signed)
Dylan [name] called to advise detailed instructions are needed for the  predniSONE (DELTASONE) 10 MG tablet prescription

## 2020-01-06 NOTE — Progress Notes (Signed)
Thank you Dr. Terrace Arabia !!! I appreciate you forwarding your note and seeing this really nice patient.

## 2020-01-06 NOTE — Patient Instructions (Signed)
Myasthenia Gravis Myasthenia gravis (MG) is a long-term (chronic) condition that causes weakness in the muscles you can control (voluntary muscles). MG can affect any voluntary muscle. The muscles most often affected are the ones that control:  Eye movement.  Facial movements.  Swallowing. MG is a disease in which the body's disease-fighting system (immune system) attacks its own healthy tissues (autoimmune disease). When you have MG, your immune system makes proteins (antibodies) that block the chemical (acetylcholine) that your body needs to send nerve signals to your muscles. This causes muscle weakness. What are the causes? The exact cause of MG is not known. What increases the risk? The following factors may make you more likely to develop this condition:  Having an enlarged thymus gland. The thymus gland is located under the breastbone. It makes certain cells for the immune system.  Having a family history of MG. What are the signs or symptoms? Symptoms of MG may include:  Drooping eyelids.  Double vision.  Muscle weakness that gets worse with activity and gets better after rest.  Difficulty walking.  Trouble chewing and swallowing.  Trouble making facial expressions.  Slurred speech.  Weakness of the arms, hands, and legs. Sudden, severe difficulty breathing (myasthenic crisis) may develop after having:  An infection.  A fever.  A bad reaction to a medicine. Myasthenic crisis requires emergency breathing support. Sometimes symptoms of MG go away for a while (remission) and then come back later. How is this diagnosed? This condition may be diagnosed based on:  Your symptoms and medical history.  A physical exam.  Blood tests.  Tests of your muscle strength and function.  Imaging tests, such as a CT scan or an MRI. How is this treated? The goal of treatment is to improve muscle strength. Treatment may include:  Taking medicine.  Making lifestyle  changes that focus on saving your energy.  Doing physical therapy to gain strength.  Having surgery to remove the thymus gland (thymectomy). This may result in a long remission for some people.  Having a procedure to remove the acetylcholine antibodies (plasmapheresis).  Getting emergency breathing support, if you experience myasthenic crisis. If you experience remission, you may be able to stop treatment and then resume treatment when your symptoms return. Follow these instructions at home:   Take over-the-counter and prescription medicines only as told by your health care provider.  Get plenty of rest and sleep. Take frequent breaks to rest your eyes, especially when in bright light or working on a computer.  Maintain a healthy diet and a healthy weight. Work with your health care provider or a diet and nutrition specialist (dietitian) if you need help.  Do exercises as told by your health care provider or physical therapist.  Do not use any products that contain nicotine or tobacco, such as cigarettes and e-cigarettes. If you need help quitting, ask your health care provider.  Prevent infections by: ? Washing your hands often with soap and water. If soap and water are not available, use hand sanitizer. ? Avoiding contact with other people who are sick. ? Avoiding touching your eyes, nose, and mouth. ? Cleaning surfaces in your home that are touched often using a disinfectant.  Keep all follow-up visits as told by your health care provider. This is important. Contact a health care provider if:  Your symptoms change or get worse, especially after having a fever or infection. Get help right away if:  You have trouble breathing. Summary  Myasthenia gravis (MG) is   a long-term (chronic) condition that causes weakness in the muscles you can control (voluntary muscles).  A symptom of MG is muscle weakness that gets worse with activity and gets better after rest.  Sudden, severe  difficulty breathing (myasthenic crisis) may develop after having an infection, a fever, or a bad reaction to a medicine.  The goal of treatment is to improve muscle strength. Treatment may include medicines, lifestyle changes, physical therapy, surgery, plasmapheresis, or emergency breathing support. This information is not intended to replace advice given to you by your health care provider. Make sure you discuss any questions you have with your health care provider. Document Revised: 12/08/2017 Document Reviewed: 12/08/2017 Elsevier Patient Education  2020 [employer] Inc[employer]    Prednisone 10mg   6 tablets every morning for 2 weeks. 4 tablets every morning for 2 weeks 3 tablets every morning

## 2020-01-06 NOTE — Telephone Encounter (Signed)
Per Dr. Zannie Cove note:  Prednisone 10mg  6 tablets every morning for 2 weeks. 4 tablets every morning for 2 weeks 3 tablets every morning  I also verified this with her verbally.  Rx resent to the pharmacy with instructions.

## 2020-01-06 NOTE — Progress Notes (Signed)
PATIENT: Dylan [name]. DOB: [birth year]  Chief Complaint  Patient presents with  . Myasthenia Gravis    Referral from Dr. Lucia Gaskins to discuss treatment for Myasthenia Gravis.     HISTORICAL  Dylan [name]. is a 24 year old male, seen in request by my colleague Dr. Lucia Gaskins for evaluation of myasthenia gravis, initial evaluation was on January 06, 2020.  I have reviewed and summarized the referring note from the referring physician.  He was seen by Dr. Lucia Gaskins on January 2021, complained of 2 years history of muscle weakness, laboratory evaluation showed positive acetylcholine blocking antibody, binding antibody with titer of 7.58, mild elevated CPK 457  He used to play sports regularly, basketball, wrestling, and workout regularly, in August 2018, when he went back to workout after a month off, he noticed generalized weakness, difficulty moving around, difficulty lifting weight, his weakness intermittent, but overall gradually getting worse over the past 2 years, sometimes he is so weak to the point of could not lift a soda can, has difficulty walking, fell to the floor, could not get himself up, he denies significant ptosis, diplopia, denies swallowing difficulty, does feel short winded with minimum exertion,  He works as a Psychologist, clinical delivery person, 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 He denies sensory loss, he complains of diffuse muscle achy pain  REVIEW OF SYSTEMS: Full 14 system review of systems performed and notable only for as above All other review of systems were negative.  ALLERGIES: No Known Allergies  HOME MEDICATIONS: No current outpatient medications on file.   No current facility-administered medications for this visit.    PAST MEDICAL HISTORY: Past Medical History:  Diagnosis Date  . HyperCKemia     PAST SURGICAL HISTORY: Past Surgical History:  Procedure Laterality Date  . APPENDECTOMY    .  LAPAROSCOPIC APPENDECTOMY N/A 01/10/2017   Procedure: LAPAROSCOPIC APPENDECTOMY;  Surgeon: Gaynelle Adu, MD;  Location: [facility] OR;  Service: General;  Laterality: N/A;    FAMILY HISTORY: Family History  Problem Relation Age of Onset  . Carpal tunnel syndrome Mother   . Diabetes Father   . Cancer Paternal Aunt        breast  . Stroke Neg Hx   . Heart disease Neg Hx   . Muscular dystrophy Neg Hx     SOCIAL HISTORY: Social History   Socioeconomic History  . Marital status: Single    Spouse name: Not on file  . Number of children: 1  . Years of education: Not on file  . Highest education level: Some college, no degree  Occupational History  . Not on file  Tobacco Use  . Smoking status: Never Smoker  . Smokeless tobacco: Never Used  Substance and Sexual Activity  . Alcohol use: No  . Drug use: Yes    Types: Marijuana    Comment: 4-5 use/week  . Sexual activity: Not on file  Other Topics Concern  . Not on file  Social History Narrative   Lives at home with his girlfriend and daughter   Right handed   Caffeine: 2 cans/day of soda off and on   Social Determinants of Health   Financial Resource Strain:   . Difficulty of Paying Living Expenses: Not on file  Food Insecurity:   . Worried About Programme researcher, broadcasting/film/video in the Last Year: Not on file  . Ran Out of Food in the Last Year: Not on file  Transportation Needs:   .  Lack of Transportation (Medical): Not on file  . Lack of Transportation (Non-Medical): Not on file  Physical Activity:   . Days of Exercise per Week: Not on file  . Minutes of Exercise per Session: Not on file  Stress:   . Feeling of Stress : Not on file  Social Connections:   . Frequency of Communication with Friends and Family: Not on file  . Frequency of Social Gatherings with Friends and Family: Not on file  . Attends Religious Services: Not on file  . Active Member of Clubs or Organizations: Not on file  . Attends Archivist Meetings: Not on  file  . Marital Status: Not on file  Intimate Partner Violence:   . Fear of Current or Ex-Partner: Not on file  . Emotionally Abused: Not on file  . Physically Abused: Not on file  . Sexually Abused: Not on file     PHYSICAL EXAM   Vitals:   01/06/20 1534  BP: 135/85  Pulse: 91  Temp: 97.7 F (36.5 C)  Weight: 244 lb (110.7 kg)  Height: 5\' 9"  (1.753 m)    Not recorded      Body mass index is 36.03 kg/m.  PHYSICAL EXAMNIATION:  Gen: NAD, conversant, well nourised, well groomed                     Cardiovascular: Regular rate rhythm, no peripheral edema, warm, nontender. Eyes: Conjunctivae clear without exudates or hemorrhage Neck: Supple, no carotid bruits. Pulmonary: Clear to auscultation bilaterally   NEUROLOGICAL EXAM:  MENTAL STATUS: Speech:    Speech is normal; fluent and spontaneous with normal comprehension.  Cognition:     Orientation to time, place and person     Normal recent and remote memory     Normal Attention span and concentration     Normal Language, naming, repeating,spontaneous speech     Fund of knowledge   CRANIAL NERVES: CN II: Visual fields are full to confrontation. Pupils are round equal and briskly reactive to light. CN III, IV, VI: extraocular movement are normal. No ptosis. CN V: Facial sensation is intact to light touch CN VII: He has mild to moderate eye closure, cheek puff weakness. CN VIII: Hearing is normal to causal conversation. CN IX, X: Phonation is normal. CN XI: Head turning and shoulder shrug are intact  MOTOR: He has moderate neck flexion weakness, he has mild bilateral shoulder abduction, external rotation, elbow flexion weakness, moderate bilateral hip flexion weakness,  REFLEXES: Reflexes are 2+ and symmetric at the biceps, triceps, knees, and ankles. Plantar responses are flexor.  SENSORY: Intact to light touch, pinprick and vibratory sensation are intact in fingers and toes.  COORDINATION: There is no  trunk or limb dysmetria noted.  GAIT/STANCE: He needs push-up to get up from seated position by multiple effort, mildly lordotic, unsteady gait   DIAGNOSTIC DATA (LABS, IMAGING, TESTING) - I reviewed patient records, labs, notes, testing and imaging myself where available.   ASSESSMENT AND PLAN  Dylan [name]. is a 24 y.o. male   Seropositive generalized myasthenia gravis  Mild involvement of bulbar muscles, moderate neck flexion, limb muscle weakness  CT chest without contrast to rule out thymus pathology  Mestinon 60 mg 3 times a day  Prednisone 60 mg for 2 weeks, then 40 mg for 2 weeks, stay at 30 mg  follow-up in 4 weeks   Dylan [name], M.D. Ph.D.  [facility] [address], [address]  [place], [place] [address] Ph: [phone] Fax: [phone]  CC: Referring Provider

## 2020-01-07 LAB — COMPREHENSIVE METABOLIC PANEL
ALT: 51 IU/L — ABNORMAL HIGH (ref 0–44)
AST: 28 IU/L (ref 0–40)
Albumin/Globulin Ratio: 1.6 (ref 1.2–2.2)
Albumin: 5.1 g/dL (ref 4.1–5.2)
Alkaline Phosphatase: 64 IU/L (ref 39–117)
BUN/Creatinine Ratio: 10 (ref 9–20)
BUN: 9 mg/dL (ref 6–20)
Bilirubin Total: 0.5 mg/dL (ref 0.0–1.2)
CO2: 23 mmol/L (ref 20–29)
Calcium: 9.5 mg/dL (ref 8.7–10.2)
Chloride: 104 mmol/L (ref 96–106)
Creatinine, Ser: 0.9 mg/dL (ref 0.76–1.27)
GFR calc Af Amer: 139 mL/min/{1.73_m2} (ref >59)
GFR calc non Af Amer: 120 mL/min/{1.73_m2} (ref >59)
Globulin, Total: 3.1 g/dL (ref 1.5–4.5)
Glucose: 87 mg/dL (ref 65–99)
Potassium: 4.3 mmol/L (ref 3.5–5.2)
Sodium: 139 mmol/L (ref 134–144)
Total Protein: 8.2 g/dL (ref 6.0–8.5)

## 2020-01-07 LAB — CBC
Hematocrit: 43.8 % (ref 37.5–51.0)
Hemoglobin: 14.6 g/dL (ref 13.0–17.7)
MCH: 30.4 pg (ref 26.6–33.0)
MCHC: 33.3 g/dL (ref 31.5–35.7)
MCV: 91 fL (ref 79–97)
Platelets: 253 10*3/uL (ref 150–450)
RBC: 4.81 x10E6/uL (ref 4.14–5.80)
RDW: 12.3 % (ref 11.6–15.4)
WBC: 5.4 10*3/uL (ref 3.4–10.8)

## 2020-01-07 LAB — METHYLMALONIC ACID, SERUM: Methylmalonic Acid: 84 nmol/L (ref 0–378)

## 2020-01-07 LAB — MULTIPLE MYELOMA PANEL, SERUM
Albumin SerPl Elph-Mcnc: 4.6 g/dL — ABNORMAL HIGH (ref 2.9–4.4)
Albumin/Glob SerPl: 1.3 (ref 0.7–1.7)
Alpha 1: 0.2 g/dL (ref 0.0–0.4)
Alpha2 Glob SerPl Elph-Mcnc: 0.6 g/dL (ref 0.4–1.0)
B-Globulin SerPl Elph-Mcnc: 1.2 g/dL (ref 0.7–1.3)
Gamma Glob SerPl Elph-Mcnc: 1.6 g/dL (ref 0.4–1.8)
Globulin, Total: 3.6 g/dL (ref 2.2–3.9)
IgA/Immunoglobulin A, Serum: 300 mg/dL (ref 90–386)
IgG (Immunoglobin G), Serum: 1793 mg/dL — ABNORMAL HIGH (ref 603–1613)
IgM (Immunoglobulin M), Srm: 55 mg/dL (ref 20–172)

## 2020-01-07 LAB — RHEUMATOID FACTOR: Rheumatoid fact SerPl-aCnc: <10 IU/mL (ref 0.0–13.9)

## 2020-01-07 LAB — ACETYLCHOLINE RECEPTOR, BINDING: AChR Binding Ab, Serum: 7.58 nmol/L — ABNORMAL HIGH (ref 0.00–0.24)

## 2020-01-07 LAB — SJOGREN'S SYNDROME ANTIBODS(SSA + SSB)
ENA SSA (RO) Ab: 5.9 AI — ABNORMAL HIGH (ref 0.0–0.9)
ENA SSB (LA) Ab: <0.2 AI (ref 0.0–0.9)

## 2020-01-07 LAB — MAGNESIUM: Magnesium: 2 mg/dL (ref 1.6–2.3)

## 2020-01-07 LAB — CK: Total CK: 457 U/L — ABNORMAL HIGH (ref 49–439)

## 2020-01-07 LAB — ANGIOTENSIN CONVERTING ENZYME: Angio Convert Enzyme: 45 U/L (ref 14–82)

## 2020-01-07 LAB — HEPATITIS C ANTIBODY: Hep C Virus Ab: <0.1 s/co ratio (ref 0.0–0.9)

## 2020-01-07 LAB — ACETYLCHOLINE RECEPTOR, BLOCKING: Acetylchol Block Ab: 34 % — ABNORMAL HIGH (ref 0–25)

## 2020-01-07 LAB — HEMOGLOBIN A1C
Est. average glucose Bld gHb Est-mCnc: 103 mg/dL
Hgb A1c MFr Bld: 5.2 % (ref 4.8–5.6)

## 2020-01-07 LAB — B12 AND FOLATE PANEL
Folate: 11.3 ng/mL (ref >3.0)
Vitamin B-12: 691 pg/mL (ref 232–1245)

## 2020-01-07 LAB — VITAMIN B6: Vitamin B6: 22.7 ug/L (ref 5.3–46.7)

## 2020-01-07 LAB — HIV ANTIBODY (ROUTINE TESTING W REFLEX): HIV Screen 4th Generation wRfx: NONREACTIVE

## 2020-01-07 LAB — ALDOLASE: Aldolase: 6.2 U/L (ref 3.3–10.3)

## 2020-01-07 LAB — HEAVY METALS, BLOOD
Arsenic: 10 ug/L (ref 2–23)
Lead, Blood: <1 ug/dL (ref 0–4)
Mercury: 1.4 ug/L (ref 0.0–14.9)

## 2020-01-07 LAB — VITAMIN B1: Thiamine: 117.9 nmol/L (ref 66.5–200.0)

## 2020-01-07 LAB — LACTIC ACID, PLASMA: Lactate, Ven: 5 mg/dL (ref 4.8–25.7)

## 2020-01-07 LAB — SEDIMENTATION RATE: Sed Rate: 39 mm/hr — ABNORMAL HIGH (ref 0–15)

## 2020-01-07 LAB — TSH: TSH: 1.03 u[IU]/mL (ref 0.450–4.500)

## 2020-01-07 LAB — ACETYLCHOLINE RECEPTOR, MODULATING: Acetylcholine Modulat Ab: 38 % — ABNORMAL HIGH (ref 0–20)

## 2020-01-07 LAB — RPR: RPR Ser Ql: NONREACTIVE

## 2020-01-24 ENCOUNTER — Telehealth: Payer: Self-pay | Admitting: Neurology

## 2020-01-24 NOTE — Telephone Encounter (Signed)
Patient complains worsening weakness, please check with patient if he has any swallowing difficulty, or breathing difficulty, and the medication he is taking  He is on schedule for imaging study, MRI of the brain, cervical spine, CT chest for tomorrow  If he does not have significant breathing difficulty, swallowing difficulty, may move up his schedule on Wednesday, after his scans,

## 2020-01-24 NOTE — Telephone Encounter (Signed)
I spoke to the patient. Denies swallowing or breathing difficulty. Feels pressure in his chest in addition to the reported symptoms below. His appt has been moved to 01/26/20.

## 2020-01-25 ENCOUNTER — Other Ambulatory Visit: Payer: Self-pay

## 2020-01-25 ENCOUNTER — Ambulatory Visit
Admission: RE | Admit: 2020-01-25 | Discharge: 2020-01-25 | Disposition: A | Discharge status: Home / Self Care | Payer: Managed Care, Other (non HMO) | Source: Ambulatory Visit | Attending: Neurology | Admitting: Neurology

## 2020-01-25 DIAGNOSIS — R748 Abnormal levels of other serum enzymes: Secondary | ICD-10-CM

## 2020-01-25 DIAGNOSIS — G510 Bell's palsy: Secondary | ICD-10-CM

## 2020-01-25 DIAGNOSIS — R29818 Other symptoms and signs involving the nervous system: Secondary | ICD-10-CM

## 2020-01-25 DIAGNOSIS — G709 Myoneural disorder, unspecified: Secondary | ICD-10-CM

## 2020-01-25 DIAGNOSIS — M6281 Muscle weakness (generalized): Secondary | ICD-10-CM

## 2020-01-25 DIAGNOSIS — R29810 Facial weakness: Secondary | ICD-10-CM

## 2020-01-25 DIAGNOSIS — G7000 Myasthenia gravis without (acute) exacerbation: Secondary | ICD-10-CM

## 2020-01-25 DIAGNOSIS — W19XXXA Unspecified fall, initial encounter: Secondary | ICD-10-CM

## 2020-01-25 DIAGNOSIS — R531 Weakness: Secondary | ICD-10-CM

## 2020-01-25 MED ORDER — GADOBENATE DIMEGLUMINE 529 MG/ML IV SOLN
20.0000 mL | Freq: Once | INTRAVENOUS | Status: AC | PRN
  Administered 2020-01-25: 20 mL via INTRAVENOUS

## 2020-01-26 ENCOUNTER — Ambulatory Visit: Payer: Managed Care, Other (non HMO) | Admitting: Neurology

## 2020-01-26 ENCOUNTER — Encounter: Payer: Self-pay | Admitting: Neurology

## 2020-01-26 ENCOUNTER — Other Ambulatory Visit: Payer: Self-pay

## 2020-01-26 VITALS — BP 136/69 | HR 82 | Temp 97.9°F | Ht 69.0 in | Wt 246.5 lb

## 2020-01-26 DIAGNOSIS — R531 Weakness: Secondary | ICD-10-CM

## 2020-01-26 DIAGNOSIS — G7000 Myasthenia gravis without (acute) exacerbation: Secondary | ICD-10-CM | POA: Diagnosis not present

## 2020-01-26 MED ORDER — PYRIDOSTIGMINE BROMIDE 60 MG PO TABS: 60.0000 mg | ORAL_TABLET | Freq: Four times a day (QID) | ORAL | 11 refills | Status: DC

## 2020-01-26 MED ORDER — MYCOPHENOLATE MOFETIL 500 MG PO TABS: 1500.0000 mg | ORAL_TABLET | Freq: Two times a day (BID) | ORAL | 11 refills | Status: DC

## 2020-01-26 NOTE — Progress Notes (Signed)
PATIENT: Dylan [name]. DOB: 24-Apr-1996  Chief Complaint  Patient presents with  . Myasthenia Gravis    Reports increased difficulty walking and worsening of muscle pain. He also feels pressure in his chest. He completed his brain and cervical MRI scans on 01/25/20. He is currently taking Prednisone 40mg  daily and Mestinon 60mg  TID.      HISTORICAL  Alen Matheson. is a 24 year old male, seen in request by my colleague Dr. Lavone [name] for evaluation of myasthenia gravis, initial evaluation was on January 06, 2020.  I have reviewed and summarized the referring note from the referring physician.  He was seen by Dr. Lucia Gaskins on January 2021, complained of 2 years history of muscle weakness, laboratory evaluation showed positive acetylcholine blocking antibody, binding antibody with titer of 7.58, mild elevated CPK 457  He used to play sports regularly, basketball, wrestling, and workout regularly, in August 2018, when he went back to workout after a month off, he noticed generalized weakness, difficulty moving around, difficulty lifting weight, his weakness intermittent, but overall gradually getting worse over the past 2 years, sometimes he is so weak to the point of could not lift a soda can, has difficulty walking, fell to the floor, could not get himself up, he denies significant ptosis, diplopia, denies swallowing difficulty, does feel short winded with minimum exertion,  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, 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, 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[employer] He denies sensory loss, he complains of diffuse muscle achy pain  UPDATE Jan 26 2020: He is taking prednisone 40 mg daily, noticed mild improvement while taking 60 mg daily, Mestinon 60 mg 3 times daily has helped his symptoms, but he still has intermittent weakness, "crushed after a while", he denies swallowing difficulty, no breathing difficulty, felt chest pressure sometimes  I personally  reviewed CT chest, no thymus pathology noted  MRI of the brain and cervical spine showed no significant abnormality  Laboratory evaluations, positive acetylcholine receptor binding, blocking, and modulating antibodies, elevated SSA 5.9, normal and negative RPR, hepatitis C, rheumatoid factor, heavy metal, vitamin B1, 6, methylmalonic acid level, A1c, lactic acid, , magnesium, TSH, HIV, B12,  Normal IgA level, mild elevated IgG 1793,  REVIEW OF SYSTEMS: Full 14 system review of systems performed and notable only for as above All other review of systems were negative.  ALLERGIES: No Known Allergies  HOME MEDICATIONS: Current Outpatient Medications  Medication Sig Dispense Refill  . famotidine (PEPCID) 10 MG tablet Take 1 tablet (10 mg total) by mouth 2 (two) times daily as needed for heartburn or indigestion. 60 tablet 11  . predniSONE (DELTASONE) 10 MG tablet 6 tablets every morning for 2 weeks. 4 tablets every morning for 2 weeks 3 tablets every morning thereafter 180 tablet 3  . pyridostigmine (MESTINON) 60 MG tablet Take 1 tablet (60 mg total) by mouth 3 (three) times daily. 90 tablet 11   No current facility-administered medications for this visit.    PAST MEDICAL HISTORY: Past Medical History:  Diagnosis Date  . HyperCKemia     PAST SURGICAL HISTORY: Past Surgical History:  Procedure Laterality Date  . APPENDECTOMY    . LAPAROSCOPIC APPENDECTOMY N/A 01/10/2017   Procedure: LAPAROSCOPIC APPENDECTOMY;  Surgeon: 12-26-1995, MD;  Location: [facility] OR;  Service: General;  Laterality: N/A;    FAMILY HISTORY: Family History  Problem Relation Age of Onset  . Carpal tunnel syndrome Mother   . Diabetes Father   .  Cancer Paternal Aunt        breast  . Stroke Neg Hx   . Heart disease Neg Hx   . Muscular dystrophy Neg Hx     SOCIAL HISTORY: Social History   Socioeconomic History  . Marital status: Single    Spouse name: Not on file  . Number of children: 1  . Years of  education: Not on file  . Highest education level: Some college, no degree  Occupational History  . Not on file  Tobacco Use  . Smoking status: Never Smoker  . Smokeless tobacco: Never Used  Substance and Sexual Activity  . Alcohol use: No  . Drug use: Yes    Types: Marijuana    Comment: 4-5 use/week  . Sexual activity: Not on file  Other Topics Concern  . Not on file  Social History Narrative   Lives at home with his girlfriend and daughter   Right handed   Caffeine: 2 cans/day of soda off and on   Social Determinants of Health   Financial Resource Strain:   . Difficulty of Paying Living Expenses: Not on file  Food Insecurity:   . Worried About Programme researcher, broadcasting/film/video in the Last Year: Not on file  . Ran Out of Food in the Last Year: Not on file  Transportation Needs:   . Lack of Transportation (Medical): Not on file  . Lack of Transportation (Non-Medical): Not on file  Physical Activity:   . Days of Exercise per Week: Not on file  . Minutes of Exercise per Session: Not on file  Stress:   . Feeling of Stress : Not on file  Social Connections:   . Frequency of Communication with Friends and Family: Not on file  . Frequency of Social Gatherings with Friends and Family: Not on file  . Attends Religious Services: Not on file  . Active Member of Clubs or Organizations: Not on file  . Attends Banker Meetings: Not on file  . Marital Status: Not on file  Intimate Partner Violence:   . Fear of Current or Ex-Partner: Not on file  . Emotionally Abused: Not on file  . Physically Abused: Not on file  . Sexually Abused: Not on file     PHYSICAL EXAM   Vitals:   01/26/20 1602  BP: 136/69  Pulse: 82  Temp: 97.9 F (36.6 C)  Weight: 246 lb 8 oz (111.8 kg)  Height: 5\' 9"  (1.753 m)    Not recorded      Body mass index is 36.4 kg/m.  PHYSICAL EXAMNIATION:  Gen: NAD, conversant, well nourised, well groomed                     Cardiovascular: Regular rate  rhythm, no peripheral edema, warm, nontender. Eyes: Conjunctivae clear without exudates or hemorrhage Neck: Supple, no carotid bruits. Pulmonary: Clear to auscultation bilaterally   NEUROLOGICAL EXAM:  MENTAL STATUS: Speech:    Speech is normal; fluent and spontaneous with normal comprehension.  Cognition:     Orientation to time, place and person     Normal recent and remote memory     Normal Attention span and concentration     Normal Language, naming, repeating,spontaneous speech     Fund of knowledge   CRANIAL NERVES: CN II: Visual fields are full to confrontation. Pupils are round equal and briskly reactive to light. CN III, IV, VI: extraocular movement are normal. No ptosis. CN V: Facial sensation is  intact to light touch CN VII: He has no significant eye closure weakness, mild cheek puff weakness. CN VIII: Hearing is normal to causal conversation. CN IX, X: Phonation is normal. CN XI: Head turning and shoulder shrug are intact  MOTOR: Examination was taken 3 hours after Mestinon 60 mg He has mild to moderate neck flexion weakness, he has mild bilateral shoulder abduction, external rotation, elbow flexion weakness,   REFLEXES: Reflexes are 2+ and symmetric at the biceps, triceps, knees, and ankles. Plantar responses are flexor.  SENSORY: Intact to light touch, pinprick and vibratory sensation are intact in fingers and toes.  COORDINATION: There is no trunk or limb dysmetria noted.  GAIT/STANCE: He can get up from seated position arms crossed, steady, able to walk tiptoe, heels, tandem walking   DIAGNOSTIC DATA (LABS, IMAGING, TESTING) - I reviewed patient records, labs, notes, testing and imaging myself where available.   ASSESSMENT AND PLAN  Timothy Trudell. is a 24 y.o. male   Seropositive generalized myasthenia gravis  Mild involvement of bulbar muscles, moderate neck flexion, limb muscle weakness  I have called radiologist Dr. Clovis Riley, CT chest  showed no thymus pathology  I can see improvement compared to previous examination on January 05, 2019, but he still complains of difficulty handling his job, daily life because of weakness,  Increased Mestinon 60 mg to 4 times a day  Keep prednisone 40 mg daily  Starting CellCept 500 mg 3 tablets twice a day,  Will try IVIG, 2 g/kg, divided into 2 days, followed by 1 g/kg once every 3 weeks  Elevated CPK  The unusual features is he complains of deep muscle achy pain, previously showed elevated CPK, will repeat level again  follow-up in 4 weeks   Marcial Pacas, M.D. Ph.D.  [facility] [address], [place], [place] [address] Ph: [phone] Fax: [phone]  CC: Referring Provider

## 2020-01-27 LAB — CK: Total CK: 450 U/L — ABNORMAL HIGH (ref 49–439)

## 2020-02-07 ENCOUNTER — Ambulatory Visit (INDEPENDENT_AMBULATORY_CARE_PROVIDER_SITE_OTHER): Payer: Managed Care, Other (non HMO)

## 2020-02-07 ENCOUNTER — Ambulatory Visit
Admission: EM | Admit: 2020-02-07 | Discharge: 2020-02-07 | Disposition: A | Discharge status: Home / Self Care | Payer: Managed Care, Other (non HMO) | Attending: Physician Assistant | Admitting: Physician Assistant

## 2020-02-07 DIAGNOSIS — R0602 Shortness of breath: Secondary | ICD-10-CM | POA: Diagnosis not present

## 2020-02-07 DIAGNOSIS — R05 Cough: Secondary | ICD-10-CM

## 2020-02-07 DIAGNOSIS — R112 Nausea with vomiting, unspecified: Secondary | ICD-10-CM | POA: Diagnosis not present

## 2020-02-07 DIAGNOSIS — R0600 Dyspnea, unspecified: Secondary | ICD-10-CM

## 2020-02-07 DIAGNOSIS — R197 Diarrhea, unspecified: Secondary | ICD-10-CM

## 2020-02-07 DIAGNOSIS — R0609 Other forms of dyspnea: Secondary | ICD-10-CM

## 2020-02-07 DIAGNOSIS — Z20822 Contact with and (suspected) exposure to covid-19: Secondary | ICD-10-CM | POA: Diagnosis not present

## 2020-02-07 DIAGNOSIS — R0789 Other chest pain: Secondary | ICD-10-CM

## 2020-02-07 DIAGNOSIS — R059 Cough, unspecified: Secondary | ICD-10-CM

## 2020-02-07 HISTORY — DX: Myasthenia gravis without (acute) exacerbation: G70.00

## 2020-02-07 LAB — CBC WITH DIFFERENTIAL/PLATELET
Basophils Absolute: 0 10*3/uL (ref 0.0–0.2)
Basos: 0 %
EOS (ABSOLUTE): 0 10*3/uL (ref 0.0–0.4)
Eos: 0 %
Hematocrit: 51.2 % — ABNORMAL HIGH (ref 37.5–51.0)
Hemoglobin: 16.8 g/dL (ref 13.0–17.7)
Immature Grans (Abs): 0.1 10*3/uL (ref 0.0–0.1)
Immature Granulocytes: 1 %
Lymphocytes Absolute: 0.4 10*3/uL — ABNORMAL LOW (ref 0.7–3.1)
Lymphs: 3 %
MCH: 30.8 pg (ref 26.6–33.0)
MCHC: 32.8 g/dL (ref 31.5–35.7)
MCV: 94 fL (ref 79–97)
Monocytes Absolute: 0.3 10*3/uL (ref 0.1–0.9)
Monocytes: 2 %
Neutrophils Absolute: 13 10*3/uL — ABNORMAL HIGH (ref 1.4–7.0)
Neutrophils: 94 %
Platelets: 234 10*3/uL (ref 150–450)
RBC: 5.46 x10E6/uL (ref 4.14–5.80)
RDW: 12.9 % (ref 11.6–15.4)
WBC: 13.8 10*3/uL — ABNORMAL HIGH (ref 3.4–10.8)

## 2020-02-07 LAB — C-REACTIVE PROTEIN: CRP: 23 mg/L — ABNORMAL HIGH (ref 0–10)

## 2020-02-07 LAB — POC SARS CORONAVIRUS 2 AG -  ED: SARS Coronavirus 2 Ag: NEGATIVE

## 2020-02-07 MED ORDER — ALBUTEROL SULFATE HFA 108 (90 BASE) MCG/ACT IN AERS: 1.0000 | INHALATION_SPRAY | Freq: Four times a day (QID) | RESPIRATORY_TRACT | 0 refills | Status: DC | PRN

## 2020-02-07 MED ORDER — BENZONATATE 200 MG PO CAPS: 200.0000 mg | ORAL_CAPSULE | Freq: Three times a day (TID) | ORAL | 0 refills | Status: DC

## 2020-02-07 MED ORDER — ONDANSETRON 4 MG PO TBDP: 4.0000 mg | ORAL_TABLET | Freq: Three times a day (TID) | ORAL | 0 refills | Status: DC | PRN

## 2020-02-07 NOTE — ED Triage Notes (Signed)
Pt c/o cough, sore throat, nasal congestion, center chest tightness when coughing and nausea/vomiting since yesterday. Pt states vomit x9 yesterday and twice today.

## 2020-02-07 NOTE — Discharge Instructions (Addendum)
Rapid COVID negative. COVID PCR testing ordered. I would like you to quarantine until testing results. Your chest xray shows some bronchitis changes. Start tessalon for cough. Albuterol for shortness of breath, chest tightness. Zofran for nausea/vomiting. Tylenol for pain and fever. Keep hydrated, urine should be clear to pale yellow in color. If experiencing worsening shortness of breath, trouble breathing, go to the emergency department for further evaluation needed.  Lab work sent, if we need to change your plans, we will give you a call.   Please also let neurologist know of what we are doing at this time. They may want to monitor you as well.

## 2020-02-07 NOTE — ED Provider Notes (Addendum)
[facility]    CSN: [id number] Arrival date & time: 02/07/20  1328    History   Chief Complaint Chief Complaint  Patient presents with  . Emesis    HPI Dylan [name]. is a 24 y.o. male.   24 year old male with history of myasthenia gravis comes in for 2 day of URI symptoms. Cough, sore throat, nasal congestion, chest tightness with coughing, nausea, vomiting. Chills, body aches without known fever. Epigastric pain without obvious aggravating or alleviating factor. Diarrhea, denies melena, hematochezia. Has tolerated minimal fluid intake. Dyspnea on exertion. Denies loss of taste/smell. Denies jaw weakness.  Patient with history of myasthenia gravis, currently on mycophenolate, and on long taper of prednisone.  Has completed prednisone 60 mg for 2 weeks, currently on 40 mg for 2 weeks.  States feels tired, but denies weakness that he would associate with myasthenia gravis flareup.     Past Medical History:  Diagnosis Date  . HyperCKemia   . MG (myasthenia gravis) [facility])     Patient Active Problem List   Diagnosis Date Noted  . Myasthenia gravis ([place]) 01/06/2020  . Neuromuscular disorder ([place]) 01/06/2020  . Weakness 01/06/2020  . HyperCKemia   . Fall 06/22/2019  . Pain of right thumb 05/26/2018  . Elevated CK 02/17/2018  . Elevated sed rate 02/17/2018  . Allergic rhinitis 02/17/2018  . Acute maxillary sinusitis 02/17/2018  . Muscle weakness 01/19/2018  . Dry mouth 01/19/2018  . Congestion of throat 01/19/2018  . Hoarse 01/19/2018    Past Surgical History:  Procedure Laterality Date  . APPENDECTOMY    . LAPAROSCOPIC APPENDECTOMY N/A 01/10/2017   Procedure: LAPAROSCOPIC APPENDECTOMY;  Surgeon: Greer Pickerel, MD;  Location: [place];  Service: General;  Laterality: N/A;       Home Medications    Prior to Admission medications   Medication Sig Start Date End Date Taking? Authorizing Provider  albuterol (VENTOLIN HFA) 108 (90 Base) MCG/ACT inhaler  Inhale 1-2 puffs into the lungs every 6 (six) hours as needed for wheezing or shortness of breath. 02/07/20   Tasia Catchings, Skiler Tye V, PA-C  benzonatate (TESSALON) 200 MG capsule Take 1 capsule (200 mg total) by mouth every 8 (eight) hours. 02/07/20   Tasia Catchings, Torrez Renfroe V, PA-C  famotidine (PEPCID) 10 MG tablet Take 1 tablet (10 mg total) by mouth 2 (two) times daily as needed for heartburn or indigestion. 01/06/20   Marcial Pacas, MD  mycophenolate (CELLCEPT) 500 MG tablet Take 3 tablets (1,500 mg total) by mouth 2 (two) times daily. 01/26/20   Marcial Pacas, MD  ondansetron (ZOFRAN ODT) 4 MG disintegrating tablet Take 1 tablet (4 mg total) by mouth every 8 (eight) hours as needed for nausea or vomiting. 02/07/20   Tasia Catchings, Joanny Dupree V, PA-C  predniSONE (DELTASONE) 10 MG tablet 6 tablets every morning for 2 weeks. 4 tablets every morning for 2 weeks 3 tablets every morning thereafter 01/06/20   Marcial Pacas, MD  pyridostigmine (MESTINON) 60 MG tablet Take 1 tablet (60 mg total) by mouth 4 (four) times daily. 01/26/20   Marcial Pacas, MD    Family History Family History  Problem Relation Age of Onset  . Carpal tunnel syndrome Mother   . Diabetes Father   . Cancer Paternal Aunt        breast  . Stroke Neg Hx   . Heart disease Neg Hx   . Muscular dystrophy Neg Hx     Social History Social History   Tobacco Use  .  Smoking status: Never Smoker  . Smokeless tobacco: Never Used  Substance Use Topics  . Alcohol use: No  . Drug use: Not Currently    Comment: 4-5 use/week     Allergies   Patient has no known allergies.   Review of Systems Review of Systems  Reason unable to perform ROS: See HPI as above.     Physical Exam Triage Vital Signs ED Triage Vitals  Enc Vitals Group     BP 02/07/20 1344 (!) 156/90     Pulse Rate 02/07/20 1344 92     Resp 02/07/20 1344 16     Temp 02/07/20 1344 99 F (37.2 C)     Temp Source 02/07/20 1344 Oral     SpO2 02/07/20 1344 96 %     Weight --      Height --      Head Circumference --       Peak Flow --      Pain Score 02/07/20 1345 9     Pain Loc --      Pain Edu? --      Excl. in GC? --    No data found.  Updated Vital Signs BP (!) 156/90 (BP Location: Left Arm)   Pulse 92   Temp 99 F (37.2 C) (Oral)   Resp 16   SpO2 96%   Physical Exam Constitutional:      General: He is not in acute distress.    Appearance: Normal appearance. He is not ill-appearing, toxic-appearing or diaphoretic.  HENT:     Head: Normocephalic and atraumatic.     Mouth/Throat:     Mouth: Mucous membranes are moist.     Pharynx: Oropharynx is clear. Uvula midline.  Cardiovascular:     Rate and Rhythm: Normal rate and regular rhythm.     Heart sounds: Normal heart sounds. No murmur. No friction rub. No gallop.   Pulmonary:     Effort: Pulmonary effort is normal. No accessory muscle usage, prolonged expiration, respiratory distress or retractions.     Comments: Lungs clear to auscultation without adventitious lung sounds. Chest:     Comments: Diffuse tenderness to light touch of the chest Abdominal:     General: Bowel sounds are normal.     Palpations: Abdomen is soft.     Tenderness: There is generalized abdominal tenderness. There is no guarding or rebound.  Musculoskeletal:     Cervical back: Normal range of motion and neck supple.  Neurological:     General: No focal deficit present.     Mental Status: He is alert and oriented to person, place, and time.      UC Treatments / Results  Labs (all labs ordered are listed, but only abnormal results are displayed) Labs Reviewed  CBC WITH DIFFERENTIAL/PLATELET - Abnormal; Notable for the following components:      Result Value   WBC 13.8 (*)    Hematocrit 51.2 (*)    Neutrophils Absolute 13.0 (*)    Lymphocytes Absolute 0.4 (*)    All other components within normal limits   Narrative:    Performed at:  [address], [place], [place]  [id number] Lab Director: Jolene Schimke MD, Phone:  [phone]   C-REACTIVE PROTEIN - Abnormal; Notable for the following components:   CRP 23 (*)    All other components within normal limits   Narrative:    Performed at:  [address] [facility] [address], [place], [place]  [id number] Lab Director: Jolene Schimke MD, Phone:  [phone]  NOVEL CORONAVIRUS, NAA  POC SARS CORONAVIRUS 2 AG -  ED    EKG   Radiology DG Chest 2 View  Result Date: 02/07/2020 CLINICAL DATA:  Chest pain, shortness of breath, cough and chest congestion. Nausea and vomiting. Upper respiratory symptoms. Immunocompromised. EXAM: CHEST - 2 VIEW COMPARISON:  CT scan of the chest dated 01/25/2020 FINDINGS: The heart size and mediastinal contours are within normal limits. Both lungs are clear except for minimal peribronchial thickening. The visualized skeletal structures are unremarkable. IMPRESSION: Minimal bronchitic changes. Electronically Signed   By: Francene Boyers M.D.   On: 02/07/2020 14:21    Procedures Procedures (including critical care time)  Medications Ordered in UC Medications - No data to display  Initial Impression / Assessment and Plan / UC Course  I have reviewed the triage vital signs and the nursing notes.  Pertinent labs & imaging results that were available during my care of the patient were reviewed by me and considered in my medical decision making (see chart for details).    24 year old male with history of myasthenia gravis comes in for 2-day history of URI symptoms.  Has a cough, central chest tightness, nausea, vomiting, diarrhea.  Has tolerated minimal oral intake.  Afebrile.  Has developed dyspnea on exertion.  Denies bulbar weakness.  Patient temp at 99, O2 sat at 96%.  Heart regular rate and rhythm.  Lungs clear to auscultation bilaterally without adventitious lung sounds.  He has diffuse tenderness to chest and abdomen without alarming signs.  Rapid Covid negative.  Covid PCR sent, patient to quarantine until testing results return.  Given  history of myasthenia gravis, currently on long taper of prednisone, discussed case with Dr. Leonides Grills.  Obtain chest x-ray for further evaluation.  Will draw CBC, CRP for further evaluation.  Chest x-ray shows bronchitic changes, given already on prednisone, will add albuterol for management.  Other symptomatic treatment discussed.  Strict return precautions given.  Otherwise patient to contact neurology to update of current condition.  Patient discharged in stable condition pending testing results.  Patient expresses understanding and agrees to plan.  Case discussed with Dr Leonides Grills, who agrees to plan.  Discussed lab result with Dr Leonides Grills, elevated WBC at 13.8 and CRP at 23, to continue to monitor for now. Called patient to discuss lab results. Discussed will continue to monitor with previous plan. Also discussed again to call neurology to inform of current symptoms. Return precautions given. Patient expresses understanding and agrees to plan.  Final Clinical Impressions(s) / UC Diagnoses   Final diagnoses:  Dyspnea on exertion  Cough  Nausea vomiting and diarrhea   ED Prescriptions    Medication Sig Dispense Auth. Provider   benzonatate (TESSALON) 200 MG capsule Take 1 capsule (200 mg total) by mouth every 8 (eight) hours. 21 capsule Jennyfer Nickolson V, PA-C   ondansetron (ZOFRAN ODT) 4 MG disintegrating tablet Take 1 tablet (4 mg total) by mouth every 8 (eight) hours as needed for nausea or vomiting. 20 tablet Christerpher Clos V, PA-C   albuterol (VENTOLIN HFA) 108 (90 Base) MCG/ACT inhaler Inhale 1-2 puffs into the lungs every 6 (six) hours as needed for wheezing or shortness of breath. 8 g Belinda Fisher, PA-C     PDMP not reviewed this encounter.   Belinda Fisher, PA-C 02/07/20 1804    Belinda Fisher, PA-C 02/07/20 1943

## 2020-02-08 ENCOUNTER — Other Ambulatory Visit: Payer: Self-pay | Admitting: *Deleted

## 2020-02-08 ENCOUNTER — Ambulatory Visit: Payer: Managed Care, Other (non HMO) | Admitting: Neurology

## 2020-02-08 MED ORDER — MYCOPHENOLATE MOFETIL 500 MG PO TABS: 1500.0000 mg | ORAL_TABLET | Freq: Two times a day (BID) | ORAL | 3 refills | Status: DC

## 2020-02-08 NOTE — Telephone Encounter (Signed)
I called and spoke to the patient. Reports being diagnosed with bronchitis by his PCP. He was given prescriptions for Tessalon Perles, Zofran and an Albuterol inhaler.  He also needs the Cellcept rx sent to his specialty mail order pharmacy. I called his his insurance plan [employer]ann Auerbach) who informed me to use [facility]. New rx sent to [facility] and the patient is aware. I provided him with their phone number so he could set up his acct and schedule delivery.

## 2020-02-09 ENCOUNTER — Telehealth: Payer: Self-pay | Admitting: Neurology

## 2020-02-09 LAB — NOVEL CORONAVIRUS, NAA: SARS-CoV-2, NAA: NOT DETECTED

## 2020-02-09 NOTE — Telephone Encounter (Signed)
I have called patient, with prednisone 40 mg daily, Mestinon 60 mg 4 times a day, his weakness has much improved, he had a history of severe allergy, no complaints of increased mucus secretion, congested, difficulty sleeping because of congestion, during the daytime, he denies difficulty, is able to move around without difficulty breathing  It is okay for him to take his allergy pills, I also advised him to bring all his medication at next follow-up visit on March 17, he has not get his CellCept yet.

## 2020-02-23 ENCOUNTER — Other Ambulatory Visit: Payer: Self-pay

## 2020-02-23 ENCOUNTER — Ambulatory Visit: Payer: Managed Care, Other (non HMO) | Admitting: Neurology

## 2020-02-23 ENCOUNTER — Telehealth: Payer: Self-pay | Admitting: Neurology

## 2020-02-23 ENCOUNTER — Encounter: Payer: Self-pay | Admitting: Neurology

## 2020-02-23 VITALS — BP 128/72 | HR 68 | Temp 97.9°F | Ht 69.0 in | Wt 236.0 lb

## 2020-02-23 DIAGNOSIS — G7000 Myasthenia gravis without (acute) exacerbation: Secondary | ICD-10-CM

## 2020-02-23 NOTE — Progress Notes (Signed)
PATIENT: Dylan [name]. DOB: December 18, 1995  Chief Complaint  Patient presents with  . Myasthenia Gravis    He is currently taking the following: Prednisone 10mg , 4 tabs daily; Mestinon 60mg , 1 tab QID. He has not received his CellCept from the specialty pharmacy yet. Per [facility], his IVIG is still under insurance review. He is feeling better with the increase in Mestinon. Sometimes when working out, he feels overheated more than normal and has some mild muscle pain.      HISTORICAL  Dylan [name]. is a 24 year old male, seen in request by my colleague Dr. Jaynee Eagles for evaluation of myasthenia gravis, initial evaluation was on January 06, 2020.  I have reviewed and summarized the referring note from the referring physician.  He was seen by Dr. Jaynee Eagles on January 2021, complained of 2 years history of muscle weakness, laboratory evaluation showed positive acetylcholine blocking antibody, binding antibody with titer of 7.58, mild elevated CPK 457  He used to play sports regularly, basketball, wrestling, and workout regularly, in August 2018, when he went back to workout after a month off, he noticed generalized weakness, difficulty moving around, difficulty lifting weight, his weakness intermittent, but overall gradually getting worse over the past 2 years, sometimes he is so weak to the point of could not lift a soda can, has difficulty walking, fell to the floor, could not get himself up, he denies significant ptosis, diplopia, denies swallowing difficulty, does feel short winded with minimum exertion,  He works as a Copy delivery person, 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, 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[employer] He denies sensory loss, he complains of diffuse muscle achy pain  UPDATE Jan 26 2020: He is taking prednisone 40 mg daily, noticed mild improvement while taking 60 mg daily, Mestinon 60 mg 3 times daily has helped his symptoms, but he still has intermittent  weakness, "crushed after a while", he denies swallowing difficulty, no breathing difficulty, felt chest pressure sometimes  I personally reviewed CT chest, no thymus pathology noted  MRI of the brain and cervical spine showed no significant abnormality  Laboratory evaluations, positive acetylcholine receptor binding, blocking, and modulating antibodies, elevated SSA 5.9, normal and negative RPR, hepatitis C, rheumatoid factor, heavy metal, vitamin B1, 6, methylmalonic acid level, A1c, lactic acid, , magnesium, TSH, HIV, B12,  Normal IgA level, mild elevated IgG 1793,  UPDATE February 23 2020: He was started on prednisone 60 mg since January 06, 2020, has been on prednisone 40 mg daily since January 24, 2020, tolerating it well, on higher dose of Mestinon 60 mg 4 times a day, complains of bloating, I have prescribed CellCept 1500 mg twice a day, still in the process of preauthorization, IVIG still pending  If he misses Mestinon, he would have significant weakness, he describes 1 morning when he hold his toddler, he fell to the ground, could not get up, now he learned to time his Mestinon to when he needs to be strong, he took first dose 5 AM, able to pick up his daughter sent her to school at 8 AM, then he takes a nap, second dose was around 2 PM, then 5 PM, 9 PM.   REVIEW OF SYSTEMS: Full 14 system review of systems performed and notable only for as above All other review of systems were negative.  ALLERGIES: No Known Allergies  HOME MEDICATIONS: Current Outpatient Medications  Medication Sig Dispense Refill  . albuterol (VENTOLIN HFA) 108 (90 Base) MCG/ACT inhaler Inhale  1-2 puffs into the lungs every 6 (six) hours as needed for wheezing or shortness of breath. 8 g 0  . benzonatate (TESSALON) 200 MG capsule Take 1 capsule (200 mg total) by mouth every 8 (eight) hours. 21 capsule 0  . famotidine (PEPCID) 10 MG tablet Take 1 tablet (10 mg total) by mouth 2 (two) times daily as needed for  heartburn or indigestion. 60 tablet 11  . mycophenolate (CELLCEPT) 500 MG tablet Take 3 tablets (1,500 mg total) by mouth 2 (two) times daily. 540 tablet 3  . ondansetron (ZOFRAN ODT) 4 MG disintegrating tablet Take 1 tablet (4 mg total) by mouth every 8 (eight) hours as needed for nausea or vomiting. 20 tablet 0  . predniSONE (DELTASONE) 10 MG tablet 6 tablets every morning for 2 weeks. 4 tablets every morning for 2 weeks 3 tablets every morning thereafter 180 tablet 3  . pyridostigmine (MESTINON) 60 MG tablet Take 1 tablet (60 mg total) by mouth 4 (four) times daily. 120 tablet 11   No current facility-administered medications for this visit.    PAST MEDICAL HISTORY: Past Medical History:  Diagnosis Date  . HyperCKemia   . MG (myasthenia gravis) ([facility])     PAST SURGICAL HISTORY: Past Surgical History:  Procedure Laterality Date  . APPENDECTOMY    . LAPAROSCOPIC APPENDECTOMY N/A 01/10/2017   Procedure: LAPAROSCOPIC APPENDECTOMY;  Surgeon: Gaynelle Adu, MD;  Location: [facility];  Service: General;  Laterality: N/A;    FAMILY HISTORY: Family History  Problem Relation Age of Onset  . Carpal tunnel syndrome Mother   . Diabetes Father   . Cancer Paternal Aunt        breast  . Stroke Neg Hx   . Heart disease Neg Hx   . Muscular dystrophy Neg Hx     SOCIAL HISTORY: Social History   Socioeconomic History  . Marital status: Single    Spouse name: Not on file  . Number of children: 1  . Years of education: Not on file  . Highest education level: Some college, no degree  Occupational History  . Not on file  Tobacco Use  . Smoking status: Never Smoker  . Smokeless tobacco: Never Used  Substance and Sexual Activity  . Alcohol use: No  . Drug use: Not Currently    Comment: 4-5 use/week  . Sexual activity: Not on file  Other Topics Concern  . Not on file  Social History Narrative   Lives at home with his girlfriend and daughter   Right handed   Caffeine: 2 cans/day of soda off  and on   Social Determinants of Health   Financial Resource Strain:   . Difficulty of Paying Living Expenses:   Food Insecurity:   . Worried About Programme researcher, broadcasting/film/video in the Last Year:   . Barista in the Last Year:   Transportation Needs:   . Freight forwarder (Medical):   Marland Kitchen Lack of Transportation (Non-Medical):   Physical Activity:   . Days of Exercise per Week:   . Minutes of Exercise per Session:   Stress:   . Feeling of Stress :   Social Connections:   . Frequency of Communication with Friends and Family:   . Frequency of Social Gatherings with Friends and Family:   . Attends Religious Services:   . Active Member of Clubs or Organizations:   . Attends Banker Meetings:   Marland Kitchen Marital Status:   Intimate Partner Violence:   .  Fear of Current or Ex-Partner:   . Emotionally Abused:   Marland Kitchen Physically Abused:   . Sexually Abused:      PHYSICAL EXAM   Vitals:   02/23/20 1617  BP: 128/72  Pulse: 68  Temp: 97.9 F (36.6 C)  Weight: 236 lb (107 kg)  Height: 5\' 9"  (1.753 m)    Not recorded      Body mass index is 34.85 kg/m.  PHYSICAL EXAMNIATION:  Gen: NAD, conversant, well nourised, well groomed                     Cardiovascular: Regular rate rhythm, no peripheral edema, warm, nontender. Eyes: Conjunctivae clear without exudates or hemorrhage Neck: Supple, no carotid bruits. Pulmonary: Clear to auscultation bilaterally   NEUROLOGICAL EXAM:  MENTAL STATUS: Speech:    Speech is normal; fluent and spontaneous with normal comprehension.  Cognition:     Orientation to time, place and person     Normal recent and remote memory     Normal Attention span and concentration     Normal Language, naming, repeating,spontaneous speech     Fund of knowledge   CRANIAL NERVES: CN II: Visual fields are full to confrontation. Pupils are round equal and briskly reactive to light. CN III, IV, VI: extraocular movement are normal. No ptosis. CN V:  Facial sensation is intact to light touch CN VII: He has no significant eye closure weakness, mild cheek puff weakness. CN VIII: Hearing is normal to causal conversation. CN IX, X: Phonation is normal. CN XI: Head turning and shoulder shrug are intact  MOTOR: Examination was taken 90 minutes after Mestinon 60 mg There was no significant bulbar, neck flexion, limb muscle weakness noted.  REFLEXES: Reflexes are 2+ and symmetric at the biceps, triceps, knees, and ankles. Plantar responses are flexor.  SENSORY: Intact to light touch, pinprick and vibratory sensation are intact in fingers and toes.  COORDINATION: There is no trunk or limb dysmetria noted.  GAIT/STANCE: He can get up from seated position arms crossed, steady, able to walk tiptoe, heels, tandem walking   DIAGNOSTIC DATA (LABS, IMAGING, TESTING) - I reviewed patient records, labs, notes, testing and imaging myself where available.   ASSESSMENT AND PLAN  Dylan [name]. is a 24 y.o. male   Seropositive generalized myasthenia gravis  Mild involvement of bulbar muscles, moderate neck flexion, limb muscle weakness  I have called radiologist Dr. 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 Ph.D.  [facility] [address] [place], [place] Ph: [phone] Fax: [phone]  CC: Referring Provider

## 2020-02-23 NOTE — Telephone Encounter (Signed)
Please help him with cellcept preauthorization first and also follow up on IVIG schedule

## 2020-02-24 NOTE — Telephone Encounter (Addendum)
I called [facility] and completed a conference call with the patient on the line. His Cellcept has been scheduled for delivery to his home on 02/29/20.  Per [facility], his IVIG is still under insurance review. They will call him to schedule once approved.

## 2020-03-06 ENCOUNTER — Telehealth: Payer: Self-pay | Admitting: *Deleted

## 2020-03-06 NOTE — Telephone Encounter (Signed)
Per [facility], they have attempted to reach patient several times for enroll him in patient assistance and schedule him for his IVIG.  I spoke to him this morning and he will call them today ([phone], option 1).

## 2020-03-16 ENCOUNTER — Encounter: Payer: Self-pay | Admitting: *Deleted

## 2020-03-16 NOTE — Telephone Encounter (Signed)
Per [facility], the patient still needs to call the number below to discuss his IVIG payments. [facility] has left messages for him. I have also left a message for him to call [phone]. Additionally, I informed him his therapy is on hold until he makes these arrangements. He should call them asap to avoid disruption to his care.  I will also sent him a mychart message today.

## 2020-05-02 ENCOUNTER — Encounter: Payer: Self-pay | Admitting: Neurology

## 2020-05-02 ENCOUNTER — Ambulatory Visit: Payer: Managed Care, Other (non HMO) | Admitting: Neurology

## 2020-05-02 ENCOUNTER — Other Ambulatory Visit: Payer: Self-pay

## 2020-05-02 VITALS — BP 139/79 | HR 98 | Ht 69.0 in | Wt 242.0 lb

## 2020-05-02 DIAGNOSIS — R531 Weakness: Secondary | ICD-10-CM | POA: Diagnosis not present

## 2020-05-02 NOTE — Patient Instructions (Signed)
Taper down prednisone  Prednisone 10mg  3 tablets every morning for 2 weeks, Then 2 tablets for 2 weeks, Then keep at one tab every morning

## 2020-05-02 NOTE — Progress Notes (Signed)
PATIENT: Dylan [name]. DOB: 18-Mar-1996  Chief Complaint  Patient presents with  . Myasthenia Gravis    He has continued taking Mestion, CellCept and Prednisone. He never called back to set up his IVIG treatment after multiple attempts by [facility] to reach him. He feels he is doing better but his balance is still a little off.      HISTORICAL  Dylan [name]. is a 25 year old male, seen in request by my colleague Dr. Lucia Gaskins for evaluation of myasthenia gravis, initial evaluation was on January 06, 2020.  I have reviewed and summarized the referring note from the referring physician.  He was seen by Dr. Lucia Gaskins on January 2021, complained of 2 years history of muscle weakness, laboratory evaluation showed positive acetylcholine blocking antibody, binding antibody with titer of 7.58, mild elevated CPK 457  He used to play sports regularly, basketball, wrestling, and workout regularly, in August 2018, when he went back to workout after a month off, he noticed generalized weakness, difficulty moving around, difficulty lifting weight, his weakness intermittent, but overall gradually getting worse over the past 2 years, sometimes he is so weak to the point of could not lift a soda can, has difficulty walking, fell to the floor, could not get himself up, he denies significant ptosis, diplopia, denies swallowing difficulty, does feel short winded with minimum exertion,  He works as a Psychologist, clinical delivery person, 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 He denies sensory loss, he complains of diffuse muscle achy pain  UPDATE Jan 26 2020: He is taking prednisone 40 mg daily, noticed mild improvement while taking 60 mg daily, Mestinon 60 mg 3 times daily has helped his symptoms, but he still has intermittent weakness, "crushed after a while", he denies swallowing difficulty, no breathing difficulty, felt chest pressure sometimes  I personally  reviewed CT chest, no thymus pathology noted  MRI of the brain and cervical spine showed no significant abnormality  Laboratory evaluations, positive acetylcholine receptor binding, blocking, and modulating antibodies, elevated SSA 5.9, normal and negative RPR, hepatitis C, rheumatoid factor, heavy metal, vitamin B1, 6, methylmalonic acid level, A1c, lactic acid, , magnesium, TSH, HIV, B12,  Normal IgA level, mild elevated IgG 1793,  UPDATE February 23 2020: He was started on prednisone 60 mg since January 06, 2020, has been on prednisone 40 mg daily since January 24, 2020, tolerating it well, on higher dose of Mestinon 60 mg 4 times a day, complains of bloating, I have prescribed CellCept 1500 mg twice a day, still in the process of preauthorization, IVIG still pending  If he misses Mestinon, he would have significant weakness, he describes 1 morning when he hold his toddler, he fell to the ground, could not get up, now he learned to time his Mestinon to when he needs to be strong, he took first dose 5 AM, able to pick up his daughter sent her to school at 8 AM, then he takes a nap, second dose was around 2 PM, then 5 PM, 9 PM.  UPDATE May 02 2020: He is doing very well, tolerating CellCept 1500 mg twice a day, he missed his IVIG appointment, continue taking prednisone 10 mg 4 tablets every morning, Mestinon has decreased from 4 to 3 tablets each day  He now works at a sitting down job at [employer], complains of mild unsteady gait, occasionally bilateral upper extremity weakness, he denies droopy eyelid, double vision, swallowing difficulty, or chewing difficulty,  he denies difficulty breathing  He reported his only had 60% of his past despite CellCept 1500 mg daily, prednisone 40 mg daily since February  REVIEW OF SYSTEMS: Full 14 system review of systems performed and notable only for as above All other review of systems were negative.  ALLERGIES: No Known Allergies  HOME MEDICATIONS: Current  Outpatient Medications  Medication Sig Dispense Refill  . famotidine (PEPCID) 10 MG tablet Take 1 tablet (10 mg total) by mouth 2 (two) times daily as needed for heartburn or indigestion. 60 tablet 11  . mycophenolate (CELLCEPT) 500 MG tablet Take 3 tablets (1,500 mg total) by mouth 2 (two) times daily. 540 tablet 3  . predniSONE (DELTASONE) 10 MG tablet 6 tablets every morning for 2 weeks. 4 tablets every morning for 2 weeks 3 tablets every morning thereafter 180 tablet 3  . pyridostigmine (MESTINON) 60 MG tablet Take 1 tablet (60 mg total) by mouth 4 (four) times daily. 120 tablet 11   No current facility-administered medications for this visit.    PAST MEDICAL HISTORY: Past Medical History:  Diagnosis Date  . HyperCKemia   . MG (myasthenia gravis) (HCC)     PAST SURGICAL HISTORY: Past Surgical History:  Procedure Laterality Date  . APPENDECTOMY    . LAPAROSCOPIC APPENDECTOMY N/A 01/10/2017   Procedure: LAPAROSCOPIC APPENDECTOMY;  Surgeon: Gaynelle Adu, MD;  Location: [facility] OR;  Service: General;  Laterality: N/A;    FAMILY HISTORY: Family History  Problem Relation Age of Onset  . Carpal tunnel syndrome Mother   . Diabetes Father   . Cancer Paternal Aunt        breast  . Stroke Neg Hx   . Heart disease Neg Hx   . Muscular dystrophy Neg Hx     SOCIAL HISTORY: Social History   Socioeconomic History  . Marital status: Single    Spouse name: Not on file  . Number of children: 1  . Years of education: Not on file  . Highest education level: Some college, no degree  Occupational History  . Not on file  Tobacco Use  . Smoking status: Never Smoker  . Smokeless tobacco: Never Used  Substance and Sexual Activity  . Alcohol use: No  . Drug use: Not Currently    Comment: 4-5 use/week  . Sexual activity: Not on file  Other Topics Concern  . Not on file  Social History Narrative   Lives at home with his girlfriend and daughter   Right handed   Caffeine: 2 cans/day of soda  off and on   Social Determinants of Health   Financial Resource Strain:   . Difficulty of Paying Living Expenses:   Food Insecurity:   . Worried About Programme researcher, broadcasting/film/video in the Last Year:   . Barista in the Last Year:   Transportation Needs:   . Freight forwarder (Medical):   Marland Kitchen Lack of Transportation (Non-Medical):   Physical Activity:   . Days of Exercise per Week:   . Minutes of Exercise per Session:   Stress:   . Feeling of Stress :   Social Connections:   . Frequency of Communication with Friends and Family:   . Frequency of Social Gatherings with Friends and Family:   . Attends Religious Services:   . Active Member of Clubs or Organizations:   . Attends Banker Meetings:   Marland Kitchen Marital Status:   Intimate Partner Violence:   . Fear of Current or Ex-Partner:   .  Emotionally Abused:   Marland Kitchen Physically Abused:   . Sexually Abused:      PHYSICAL EXAM   Vitals:   05/02/20 1134  BP: 139/79  Pulse: 98  Weight: 242 lb (109.8 kg)  Height: 5\' 9"  (1.753 m)    Not recorded      Body mass index is 35.74 kg/m.  PHYSICAL EXAMNIATION:  Gen: NAD, conversant, well nourised, well groomed                     Cardiovascular: Regular rate rhythm, no peripheral edema, warm, nontender. Eyes: Conjunctivae clear without exudates or hemorrhage Neck: Supple, no carotid bruits. Pulmonary: Clear to auscultation bilaterally   NEUROLOGICAL EXAM:  MENTAL STATUS: Speech:    Speech is normal; fluent and spontaneous with normal comprehension.  Cognition:     Orientation to time, place and person     Normal recent and remote memory     Normal Attention span and concentration     Normal Language, naming, repeating,spontaneous speech     Fund of knowledge   CRANIAL NERVES: CN II: Visual fields are full to confrontation. Pupils are round equal and briskly reactive to light. CN III, IV, VI: extraocular movement are normal. No ptosis. CN V: Facial sensation is  intact to light touch CN VII: He has no significant eye closure weakness, mild cheek puff weakness. CN VIII: Hearing is normal to causal conversation. CN IX, X: Phonation is normal. CN XI: Head turning and shoulder shrug are intact  MOTOR: Examination was taken 4 hours after Mestinon 60 mg He has mild bilateral upper extremity proximal muscle weakness, no neck flexion, lower extremity weakness noted.  REFLEXES: Reflexes are 2+ and symmetric at the biceps, triceps, knees, and ankles. Plantar responses are flexor.  SENSORY: Intact to light touch, pinprick and vibratory sensation are intact in fingers and toes.  COORDINATION: There is no trunk or limb dysmetria noted.  GAIT/STANCE: He can get up from seated position arms crossed, able to get up from squatting down position without difficulty  DIAGNOSTIC DATA (LABS, IMAGING, TESTING) - I reviewed patient records, labs, notes, testing and imaging myself where available.   ASSESSMENT AND PLAN  Dylan [name]. is a 24 y.o. male   Seropositive generalized myasthenia gravis  Today's examination only showed mild proximal upper extremity weakness,  CT chest showed no thymus pathology  He responded very well to prednisone, and CellCept treatment,  Prednisone treatment since January 06, 2020, 60 mg to begin with, tapered down to 40 mg since January 14, 2020, he is supposed to continue tapering down prednisone to avoid the long-term side effect, however he missed his scheduled IVIG appointment, has remained on prednisone 40 mg, will tapering at 10 mg decrements every 2 weeks, then remain on 10 mg daily  CellCept since March 2021, he is tolerating well, keep 500 mg 3 tablets twice a day, laboratory evaluations,  Mestinon 60 mg to 4 times a day since January 26, 2020, keep Mestinon 3 times a day  Reinitiate IVIG process, he reported 60% of his normal baseline with current combination of moderate prednisone 40 mg daily, CellCept 1500 mg  daily, hope he can have further improvement of his motor strength, allow [place] to taper his prednisone to much lower dose, 2 g/kg (divided into 2 days, as loading dose, 1 g/kg), followed by 1 g/kg every 4 weeks,  Return to clinic in 4 months    Marcial Pacas, M.D. Ph.D.  [facility] [address] [place], [place] [address] Ph: [phone] Fax: [phone]  CC: Referring Provider

## 2020-05-03 ENCOUNTER — Telehealth: Payer: Self-pay | Admitting: Neurology

## 2020-05-03 LAB — COMPREHENSIVE METABOLIC PANEL
ALT: 28 IU/L (ref 0–44)
AST: 14 IU/L (ref 0–40)
Albumin/Globulin Ratio: 2 (ref 1.2–2.2)
Albumin: 4.8 g/dL (ref 4.1–5.2)
Alkaline Phosphatase: 70 IU/L (ref 48–121)
BUN/Creatinine Ratio: 14 (ref 9–20)
BUN: 13 mg/dL (ref 6–20)
Bilirubin Total: 0.2 mg/dL (ref 0.0–1.2)
CO2: 21 mmol/L (ref 20–29)
Calcium: 9.7 mg/dL (ref 8.7–10.2)
Chloride: 104 mmol/L (ref 96–106)
Creatinine, Ser: 0.92 mg/dL (ref 0.76–1.27)
GFR calc Af Amer: 135 mL/min/{1.73_m2} (ref >59)
GFR calc non Af Amer: 117 mL/min/{1.73_m2} (ref >59)
Globulin, Total: 2.4 g/dL (ref 1.5–4.5)
Glucose: 137 mg/dL — ABNORMAL HIGH (ref 65–99)
Potassium: 4.4 mmol/L (ref 3.5–5.2)
Sodium: 139 mmol/L (ref 134–144)
Total Protein: 7.2 g/dL (ref 6.0–8.5)

## 2020-05-03 LAB — CBC WITH DIFFERENTIAL/PLATELET
Basophils Absolute: 0 10*3/uL (ref 0.0–0.2)
Basos: 0 %
EOS (ABSOLUTE): 0 10*3/uL (ref 0.0–0.4)
Eos: 0 %
Hematocrit: 45.9 % (ref 37.5–51.0)
Hemoglobin: 15.1 g/dL (ref 13.0–17.7)
Immature Grans (Abs): 0.2 10*3/uL — ABNORMAL HIGH (ref 0.0–0.1)
Immature Granulocytes: 1 %
Lymphocytes Absolute: 0.7 10*3/uL (ref 0.7–3.1)
Lymphs: 6 %
MCH: 30.9 pg (ref 26.6–33.0)
MCHC: 32.9 g/dL (ref 31.5–35.7)
MCV: 94 fL (ref 79–97)
Monocytes Absolute: 0.4 10*3/uL (ref 0.1–0.9)
Monocytes: 3 %
Neutrophils Absolute: 10.5 10*3/uL — ABNORMAL HIGH (ref 1.4–7.0)
Neutrophils: 90 %
Platelets: 284 10*3/uL (ref 150–450)
RBC: 4.88 x10E6/uL (ref 4.14–5.80)
RDW: 12 % (ref 11.6–15.4)
WBC: 11.8 10*3/uL — ABNORMAL HIGH (ref 3.4–10.8)

## 2020-05-03 NOTE — Telephone Encounter (Signed)
Please call patient, laboratory evaluation showed elevated WBC 11.8, absolute neutrophil 10.5, this can be related to his chronic prednisone use, there was no other significant abnormalities.

## 2020-05-03 NOTE — Telephone Encounter (Signed)
Left patient a detailed message, with results, on voicemail (ok per DPR).  Provided our number to call back with any questions.  

## 2020-06-17 ENCOUNTER — Other Ambulatory Visit: Payer: Self-pay | Admitting: Neurology

## 2020-06-19 ENCOUNTER — Other Ambulatory Visit: Payer: Self-pay | Admitting: Neurology

## 2020-06-19 MED ORDER — PREDNISONE 10 MG PO TABS: 10.0000 mg | ORAL_TABLET | Freq: Every day | ORAL | 5 refills | Status: DC

## 2020-06-27 ENCOUNTER — Telehealth: Payer: Self-pay | Admitting: Neurology

## 2020-06-27 MED ORDER — SUMATRIPTAN SUCCINATE 50 MG PO TABS: 50.0000 mg | ORAL_TABLET | ORAL | 6 refills | Status: DC | PRN

## 2020-06-27 NOTE — Telephone Encounter (Signed)
Patient complains of a headache prior to IVIG infusion on June 26, 2020, continued throughout the day, was given Toradol 30 mg IV before his second IVIG infusion on June 27, 2020 with significant improvement  He reported long history of intermittent headaches, couple times, retro-orbital area pounding headache with associated light noise sensitivity, movement made it worse, suboptimal response over-the-counter NSAIDs treatment  His history is most consistent with chronic migraine headaches Imitrex 50 as needed, may combine with Aleve

## 2020-06-28 ENCOUNTER — Other Ambulatory Visit: Payer: Self-pay | Admitting: *Deleted

## 2020-06-28 MED ORDER — SUMATRIPTAN SUCCINATE 50 MG PO TABS: ORAL_TABLET | ORAL | 6 refills | Status: DC

## 2020-06-29 ENCOUNTER — Other Ambulatory Visit: Payer: Self-pay | Admitting: *Deleted

## 2020-06-29 MED ORDER — SUMATRIPTAN SUCCINATE 50 MG PO TABS: ORAL_TABLET | ORAL | 6 refills | Status: DC

## 2020-07-03 ENCOUNTER — Ambulatory Visit (INDEPENDENT_AMBULATORY_CARE_PROVIDER_SITE_OTHER): Payer: Managed Care, Other (non HMO) | Admitting: Neurology

## 2020-07-03 DIAGNOSIS — G7000 Myasthenia gravis without (acute) exacerbation: Secondary | ICD-10-CM | POA: Diagnosis not present

## 2020-07-04 ENCOUNTER — Ambulatory Visit: Payer: Self-pay | Admitting: Neurology

## 2020-07-04 ENCOUNTER — Telehealth: Payer: Self-pay | Admitting: Neurology

## 2020-07-04 NOTE — Telephone Encounter (Signed)
This will need to be discussed with the billing department w/ [facility]. This message has been provided to them.

## 2020-07-04 NOTE — Telephone Encounter (Signed)
Pt's girlfriend, Marcelline Mates (on [place]) called with a questions.Will Pt be charged for the last infusion since it was not finished.  Pt had a rash all over his body his last infusion. Can their be a skin test done before his infusion? I informed her she would have to speak to billing. Ms. Brooke Dare stated I will call back tomorrow for billing. Ms. Brooke Dare would like a call back.

## 2020-07-06 NOTE — Telephone Encounter (Signed)
[name] w/ [facility] checked with the billing dept on this answer. She received the following response:  Good Morning,   Thank you, billing has only billed the amount that was infused prior to the infusion being stopped.   [facility] Billing Specialist Reimbursement Department p: [phone] Ext. [phone] f: [phone]  I called back and left a detailed message for the patient with this information. For further billing questions, I directed them to contact [facility].  Email from [place] [facility] on the medication infused: Dylan [name] was unable to tolerate the IVIG on day 2. He only received 50 grams out of 110 that he was supposed to receive on day 2.

## 2020-08-23 ENCOUNTER — Encounter: Payer: Self-pay | Admitting: Neurology

## 2020-08-23 NOTE — Progress Notes (Signed)
PATIENT: Dylan [name]. DOB: [birth year]  No chief complaint on file.    HISTORICAL  Dylan [name]. is a 24 year old male, seen in request by my colleague Dr. Lucia Gaskins for evaluation of myasthenia gravis, initial evaluation was on January 06, 2020.  I have reviewed and summarized the referring note from the referring physician.  He was seen by Dr. Lucia Gaskins on January 2021, complained of 2 years history of muscle weakness, laboratory evaluation showed positive acetylcholine blocking antibody, binding antibody with titer of 7.58, mild elevated CPK 457  He used to play sports regularly, basketball, wrestling, and workout regularly, in August 2018, when he went back to workout after a month off, he noticed generalized weakness, difficulty moving around, difficulty lifting weight, his weakness intermittent, but overall gradually getting worse over the past 2 years, sometimes he is so weak to the point of could not lift a soda can, has difficulty walking, fell to the floor, could not get himself up, he denies significant ptosis, diplopia, denies swallowing difficulty, does feel short winded with minimum exertion,  He works as a Psychologist, clinical delivery person, 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 He denies sensory loss, he complains of diffuse muscle achy pain  UPDATE Jan 26 2020: He is taking prednisone 40 mg daily, noticed mild improvement while taking 60 mg daily, Mestinon 60 mg 3 times daily has helped his symptoms, but he still has intermittent weakness, "crushed after a while", he denies swallowing difficulty, no breathing difficulty, felt chest pressure sometimes  I personally reviewed CT chest, no thymus pathology noted  MRI of the brain and cervical spine showed no significant abnormality  Laboratory evaluations, positive acetylcholine receptor binding, blocking, and modulating antibodies, elevated SSA 5.9, normal and negative RPR,  hepatitis C, rheumatoid factor, heavy metal, vitamin B1, 6, methylmalonic acid level, A1c, lactic acid, , magnesium, TSH, HIV, B12,  Normal IgA level, mild elevated IgG 1793,  UPDATE February 23 2020: He was started on prednisone 60 mg since January 06, 2020, has been on prednisone 40 mg daily since January 24, 2020, tolerating it well, on higher dose of Mestinon 60 mg 4 times a day, complains of bloating, I have prescribed CellCept 1500 mg twice a day, still in the process of preauthorization, IVIG still pending  If he misses Mestinon, he would have significant weakness, he describes 1 morning when he hold his toddler, he fell to the ground, could not get up, now he learned to time his Mestinon to when he needs to be strong, he took first dose 5 AM, able to pick up his daughter sent her to school at 8 AM, then he takes a nap, second dose was around 2 PM, then 5 PM, 9 PM.  UPDATE May 02 2020: He is doing very well, tolerating CellCept 1500 mg twice a day, he missed his IVIG appointment, continue taking prednisone 10 mg 4 tablets every morning, Mestinon has decreased from 4 to 3 tablets each day  He now works at a sitting down job at [employer], complains of mild unsteady gait, occasionally bilateral upper extremity weakness, he denies droopy eyelid, double vision, swallowing difficulty, or chewing difficulty, he denies difficulty breathing  He reported his only had 60% of his past despite CellCept 1500 mg daily, prednisone 40 mg daily since February  UPDATE July 03 2020: He return for IVIG treatment, complains of significant headache, during the initial days of IVIG infusion, which still present today,  moderate to severe  His myasthenia gravis symptoms overall is under good control with current medications, CellCept 500 mg 3 tablets twice a day, taper down prednisone to 10 mg now, Mestinon 60 mg 3 to 4 tablets daily, he denies significant limitation in daily function  REVIEW OF SYSTEMS: Full 14 system  review of systems performed and notable only for as above All other review of systems were negative.  ALLERGIES: No Known Allergies  HOME MEDICATIONS: Current Outpatient Medications  Medication Sig Dispense Refill  . famotidine (PEPCID) 10 MG tablet Take 1 tablet (10 mg total) by mouth 2 (two) times daily as needed for heartburn or indigestion. 60 tablet 11  . mycophenolate (CELLCEPT) 500 MG tablet Take 3 tablets (1,500 mg total) by mouth 2 (two) times daily. 540 tablet 3  . predniSONE (DELTASONE) 10 MG tablet Take 1 tablet (10 mg total) by mouth daily with breakfast. 30 tablet 5  . pyridostigmine (MESTINON) 60 MG tablet Take 1 tablet (60 mg total) by mouth 4 (four) times daily. 120 tablet 11  . SUMAtriptan (IMITREX) 50 MG tablet Take 1 tab at onset of migraine.  May repeat in 2 hrs, if needed.  Max dose: 2 tabs/day. This is a 30 day prescription. 12 tablet 6   No current facility-administered medications for this visit.    PAST MEDICAL HISTORY: Past Medical History:  Diagnosis Date  . HyperCKemia   . MG (myasthenia gravis) (HCC)     PAST SURGICAL HISTORY: Past Surgical History:  Procedure Laterality Date  . APPENDECTOMY    . LAPAROSCOPIC APPENDECTOMY N/A 01/10/2017   Procedure: LAPAROSCOPIC APPENDECTOMY;  Surgeon: Gaynelle Adu, MD;  Location: [facility] OR;  Service: General;  Laterality: N/A;    FAMILY HISTORY: Family History  Problem Relation Age of Onset  . Carpal tunnel syndrome Mother   . Diabetes Father   . Cancer Paternal Aunt        breast  . Stroke Neg Hx   . Heart disease Neg Hx   . Muscular dystrophy Neg Hx     SOCIAL HISTORY: Social History   Socioeconomic History  . Marital status: Single    Spouse name: Not on file  . Number of children: 1  . Years of education: Not on file  . Highest education level: Some college, no degree  Occupational History  . Not on file  Tobacco Use  . Smoking status: Never Smoker  . Smokeless tobacco: Never Used  Vaping Use  .  Vaping Use: Never used  Substance and Sexual Activity  . Alcohol use: No  . Drug use: Not Currently    Comment: 4-5 use/week  . Sexual activity: Not on file  Other Topics Concern  . Not on file  Social History Narrative   Lives at home with his girlfriend and daughter   Right handed   Caffeine: 2 cans/day of soda off and on   Social Determinants of Health   Financial Resource Strain:   . Difficulty of Paying Living Expenses: Not on file  Food Insecurity:   . Worried About Programme researcher, broadcasting/film/video in the Last Year: Not on file  . Ran Out of Food in the Last Year: Not on file  Transportation Needs:   . Lack of Transportation (Medical): Not on file  . Lack of Transportation (Non-Medical): Not on file  Physical Activity:   . Days of Exercise per Week: Not on file  . Minutes of Exercise per Session: Not on file  Stress:   .  Feeling of Stress : Not on file  Social Connections:   . Frequency of Communication with Friends and Family: Not on file  . Frequency of Social Gatherings with Friends and Family: Not on file  . Attends Religious Services: Not on file  . Active Member of Clubs or Organizations: Not on file  . Attends Banker Meetings: Not on file  . Marital Status: Not on file  Intimate Partner Violence:   . Fear of Current or Ex-Partner: Not on file  . Emotionally Abused: Not on file  . Physically Abused: Not on file  . Sexually Abused: Not on file     PHYSICAL EXAM   There were no vitals filed for this visit. Not recorded     There is no height or weight on file to calculate BMI.  PHYSICAL EXAMNIATION:  Gen: NAD, conversant, well nourised, well groomed  NEUROLOGICAL EXAM:  MENTAL STATUS: Speech/cognition: Awake alert oriented to history taking care of conversation   CRANIAL NERVES: CN II: Visual fields are full to confrontation. Pupils are round equal and briskly reactive to light. CN III, IV, VI: extraocular movement are normal. No ptosis. CN  V: Facial sensation is intact to light touch CN VII: He has no significant eye closure weakness, mild cheek puff weakness. CN VIII: Hearing is normal to causal conversation. CN IX, X: Phonation is normal. CN XI: Head turning and shoulder shrug are intact  MOTOR: Examination was taken 4 hours after Mestinon 60 mg He has mild bilateral upper extremity proximal muscle weakness, no neck flexion, lower extremity weakness noted.  REFLEXES: Reflexes are 2+ and symmetric at the biceps, triceps, knees, and ankles. Plantar responses are flexor.  SENSORY: Intact to light touch  COORDINATION: There is no trunk or limb dysmetria noted.  GAIT/STANCE: He can get up from seated position arms crossed, able to get up from squatting down position without difficulty  DIAGNOSTIC DATA (LABS, IMAGING, TESTING) - I reviewed patient records, labs, notes, testing and imaging myself where available.   ASSESSMENT AND PLAN  Dylan Mcshan. is a 24 y.o. male   Seropositive generalized myasthenia gravis  CT chest showed no thymus pathology  He responded very well to prednisone, and CellCept treatment,  Prednisone treatment since January 06, 2020, 60 mg to begin with, tapered down to 40 mg since January 14, 2020, continue tapering down to prednisone 10 mg daily   CellCept since March 2021, he is tolerating well, keep 500 mg 3 tablets twice a day,   Mestinon 60 mg to 4 times a day since January 26, 2020, keep Mestinon 3-4 times a day  IVIG: He was given IVIG in July 2021, could not tolerate it developed severe headache during first dose IV infusion, continued throughout the day, recurrent headache-second trial, will stop IVIG,        Levert Feinstein, M.D. Ph.D.  [facility] [address] [place], [place] [address] Ph: [phone] Fax: [phone]  CC: Referring Provider

## 2020-08-24 ENCOUNTER — Encounter: Payer: Self-pay | Admitting: *Deleted

## 2020-08-24 ENCOUNTER — Telehealth: Payer: Self-pay | Admitting: Neurology

## 2020-08-24 MED ORDER — MYCOPHENOLATE MOFETIL 500 MG PO TABS: 1500.0000 mg | ORAL_TABLET | Freq: Two times a day (BID) | ORAL | 1 refills | Status: DC

## 2020-08-24 NOTE — Telephone Encounter (Signed)
Cellcept Rx sent to [facility] as [facility] with 1 refill.

## 2020-08-24 NOTE — Telephone Encounter (Signed)
Pt's sgo Dylan [name] called stating that they are wanting the pt's mycophenolate (CELLCEPT) 500 MG tablet sent in to the [facility] on [address] Due to it being $75 cheaper there. Please advise.

## 2020-08-31 ENCOUNTER — Encounter: Payer: Managed Care, Other (non HMO) | Admitting: Medical

## 2020-09-01 ENCOUNTER — Encounter: Payer: Self-pay | Admitting: Medical

## 2020-09-05 ENCOUNTER — Encounter: Payer: Self-pay | Admitting: Neurology

## 2020-09-05 ENCOUNTER — Ambulatory Visit: Payer: Managed Care, Other (non HMO) | Admitting: Neurology

## 2020-09-05 VITALS — BP 136/92 | HR 95 | Ht 69.0 in | Wt 245.0 lb

## 2020-09-05 DIAGNOSIS — G7000 Myasthenia gravis without (acute) exacerbation: Secondary | ICD-10-CM | POA: Diagnosis not present

## 2020-09-05 DIAGNOSIS — M542 Cervicalgia: Secondary | ICD-10-CM | POA: Diagnosis not present

## 2020-09-05 DIAGNOSIS — R531 Weakness: Secondary | ICD-10-CM

## 2020-09-05 MED ORDER — PREDNISONE 10 MG PO TABS: 10.0000 mg | ORAL_TABLET | Freq: Every day | ORAL | 11 refills | Status: DC

## 2020-09-05 MED ORDER — DULOXETINE HCL 30 MG PO CPEP: 30.0000 mg | ORAL_CAPSULE | Freq: Every day | ORAL | 11 refills | Status: DC

## 2020-09-05 MED ORDER — MYCOPHENOLATE MOFETIL 500 MG PO TABS: 1500.0000 mg | ORAL_TABLET | Freq: Two times a day (BID) | ORAL | 4 refills | Status: DC

## 2020-09-05 MED ORDER — PREDNISONE 10 MG PO TABS: 10.0000 mg | ORAL_TABLET | Freq: Every day | ORAL | 4 refills | Status: DC

## 2020-09-05 MED ORDER — PYRIDOSTIGMINE BROMIDE 60 MG PO TABS: 60.0000 mg | ORAL_TABLET | Freq: Four times a day (QID) | ORAL | 11 refills | Status: DC

## 2020-09-05 MED ORDER — MELOXICAM 15 MG PO TABS: 15.0000 mg | ORAL_TABLET | Freq: Every day | ORAL | 6 refills | Status: DC

## 2020-09-05 NOTE — Progress Notes (Signed)
PATIENT: Dylan NianAnthony Darnell Broner Jr. DOB: 12/23/1995  Chief Complaint  Patient presents with  . Myasthenia Gravis    He was unable to tolerate IVIG. He has continued taking Mestinon, Cellcept and Prednisone.  Feels he is getting weaker, especially in his mouth (struggling to chew his food).     HISTORICAL  Dylan Niannthony Darnell Dittman Jr. is a 24 year old male, seen in request by my colleague Dr. Lucia GaskinsAhern for evaluation of myasthenia gravis, initial evaluation was on January 06, 2020.  I have reviewed and summarized the referring note from the referring physician.  He was seen by Dr. Lucia GaskinsAhern on January 2021, complained of 2 years history of muscle weakness, laboratory evaluation showed positive acetylcholine blocking antibody, binding antibody with titer of 7.58, mild elevated CPK 457  He used to play sports regularly, basketball, wrestling, and workout regularly, in August 2018, when he went back to workout after a month off, he noticed generalized weakness, difficulty moving around, difficulty lifting weight, his weakness intermittent, but overall gradually getting worse over the past 2 years, sometimes he is so weak to the point of could not lift a soda can, has difficulty walking, fell to the floor, could not get himself up, he denies significant ptosis, diplopia, denies swallowing difficulty, does feel short winded with minimum exertion,  He works as a Psychologist, [employer] pizza delivery person, 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 building He denies sensory loss, he complains of diffuse muscle achy pain  UPDATE Jan 26 2020: He is taking prednisone 40 mg daily, noticed mild improvement while taking 60 mg daily, Mestinon 60 mg 3 times daily has helped his symptoms, but he still has intermittent weakness, "crushed after a while", he denies swallowing difficulty, no breathing difficulty, felt chest pressure sometimes  I personally reviewed CT chest, no thymus pathology noted  MRI of the  brain and cervical spine showed no significant abnormality  Laboratory evaluations, positive acetylcholine receptor binding, blocking, and modulating antibodies, elevated SSA 5.9, normal and negative RPR, hepatitis C, rheumatoid factor, heavy metal, vitamin B1, 6, methylmalonic acid level, A1c, lactic acid, , magnesium, TSH, HIV, B12,  Normal IgA level, mild elevated IgG 1793,  UPDATE February 23 2020: He was started on prednisone 60 mg since January 06, 2020, has been on prednisone 40 mg daily since January 24, 2020, tolerating it well, on higher dose of Mestinon 60 mg 4 times a day, complains of bloating, I have prescribed CellCept 1500 mg twice a day, still in the process of preauthorization, IVIG still pending  If he misses Mestinon, he would have significant weakness, he describes 1 morning when he hold his toddler, he fell to the ground, could not get up, now he learned to time his Mestinon to when he needs to be strong, he took first dose 5 AM, able to pick up his daughter sent her to school at 8 AM, then he takes a nap, second dose was around 2 PM, then 5 PM, 9 PM.  UPDATE May 02 2020: He is doing very well, tolerating CellCept 1500 mg twice a day, he missed his IVIG appointment, continue taking prednisone 10 mg 4 tablets every morning, Mestinon has decreased from 4 to 3 tablets each day  He now works at a sitting down job at [employer], complains of mild unsteady gait, occasionally bilateral upper extremity weakness, he denies droopy eyelid, double vision, swallowing difficulty, or chewing difficulty, he denies difficulty breathing  He reported his only had 60% of  his past despite CellCept 1500 mg daily, prednisone 40 mg daily since February  UPDATE July 03 2020: He return for IVIG treatment, complains of significant headache, during the initial days of IVIG infusion, which still present today, moderate to severe  His myasthenia gravis symptoms overall is under good control with current  medications, CellCept 500 mg 3 tablets twice a day, taper down prednisone to 10 mg now, Mestinon 60 mg 3 to 4 tablets daily, he denies significant limitation in daily function  UPDATE Sept 28 2021: Patient is 52 years old daughter during today's interview, exam was taken 1 hour after Mestinon, there was no significant bulbar, or limb muscle weakness noted  Despite CellCept 500 mg 3 tablets twice a day, prednisone 10 mg daily, he still needs Mestinon 60 mg 4 times a day, if he misses a dose, sometimes he felt difficulty holding his daughter, and also weakness in his tongue,  We attempted IVIG treatment on July 03, 2020, he developed severe headache neck stiffness following the first infusion, similar presentation at the beginning of second infusion, we have to abort the IVIG treatment.  He reported ever since then, he noticed intermittent neck pain, radiating stiffness pain to bilateral shoulder.  REVIEW OF SYSTEMS: Full 14 system review of systems performed and notable only for as above All other review of systems were negative.  ALLERGIES: No Known Allergies  HOME MEDICATIONS: Current Outpatient Medications  Medication Sig Dispense Refill  . famotidine (PEPCID) 10 MG tablet Take 1 tablet (10 mg total) by mouth 2 (two) times daily as needed for heartburn or indigestion. 60 tablet 11  . mycophenolate (CELLCEPT) 500 MG tablet Take 3 tablets (1,500 mg total) by mouth 2 (two) times daily. 540 tablet 1  . predniSONE (DELTASONE) 10 MG tablet Take 1 tablet (10 mg total) by mouth daily with breakfast. 30 tablet 5  . pyridostigmine (MESTINON) 60 MG tablet Take 1 tablet (60 mg total) by mouth 4 (four) times daily. 120 tablet 11  . SUMAtriptan (IMITREX) 50 MG tablet Take 1 tab at onset of migraine.  May repeat in 2 hrs, if needed.  Max dose: 2 tabs/day. This is a 30 day prescription. 12 tablet 6   No current facility-administered medications for this visit.    PAST MEDICAL HISTORY: Past Medical  History:  Diagnosis Date  . HyperCKemia   . MG (myasthenia gravis) (HCC)     PAST SURGICAL HISTORY: Past Surgical History:  Procedure Laterality Date  . APPENDECTOMY    . LAPAROSCOPIC APPENDECTOMY N/A 01/10/2017   Procedure: LAPAROSCOPIC APPENDECTOMY;  Surgeon: Gaynelle Adu, MD;  Location: [facility] OR;  Service: General;  Laterality: N/A;    FAMILY HISTORY: Family History  Problem Relation Age of Onset  . Carpal tunnel syndrome Mother   . Diabetes Father   . Cancer Paternal Aunt        breast  . Stroke Neg Hx   . Heart disease Neg Hx   . Muscular dystrophy Neg Hx     SOCIAL HISTORY: Social History   Socioeconomic History  . Marital status: Single    Spouse name: Not on file  . Number of children: 1  . Years of education: Not on file  . Highest education level: Some college, no degree  Occupational History  . Not on file  Tobacco Use  . Smoking status: Never Smoker  . Smokeless tobacco: Never Used  Vaping Use  . Vaping Use: Never used  Substance and Sexual Activity  . Alcohol  use: No  . Drug use: Not Currently    Comment: 4-5 use/week  . Sexual activity: Not on file  Other Topics Concern  . Not on file  Social History Narrative   Lives at home with his girlfriend and daughter   Right handed   Caffeine: 2 cans/day of soda off and on   Social Determinants of Health   Financial Resource Strain:   . Difficulty of Paying Living Expenses: Not on file  Food Insecurity:   . Worried About Programme researcher, broadcasting/film/video in the Last Year: Not on file  . Ran Out of Food in the Last Year: Not on file  Transportation Needs:   . Lack of Transportation (Medical): Not on file  . Lack of Transportation (Non-Medical): Not on file  Physical Activity:   . Days of Exercise per Week: Not on file  . Minutes of Exercise per Session: Not on file  Stress:   . Feeling of Stress : Not on file  Social Connections:   . Frequency of Communication with Friends and Family: Not on file  . Frequency of  Social Gatherings with Friends and Family: Not on file  . Attends Religious Services: Not on file  . Active Member of Clubs or Organizations: Not on file  . Attends Banker Meetings: Not on file  . Marital Status: Not on file  Intimate Partner Violence:   . Fear of Current or Ex-Partner: Not on file  . Emotionally Abused: Not on file  . Physically Abused: Not on file  . Sexually Abused: Not on file     PHYSICAL EXAM   Vitals:   09/05/20 0859  BP: (!) 136/92  Pulse: 95  Weight: 245 lb (111.1 kg)  Height: 5\' 9"  (1.753 m)   Not recorded     Body mass index is 36.18 kg/m.  PHYSICAL EXAMNIATION:  Gen: NAD, conversant, well nourised, well groomed  NEUROLOGICAL EXAM:  MENTAL STATUS: Speech/cognition: Awake alert oriented to history taking care of conversation   CRANIAL NERVES: CN II: Visual fields are full to confrontation. Pupils are round equal and briskly reactive to light. CN III, IV, VI: extraocular movement are normal. No ptosis. CN V: Facial sensation is intact to light touch CN VII: He has no significant eye closure weakness, mild cheek puff weakness. CN VIII: Hearing is normal to causal conversation. CN IX, X: Phonation is normal. CN XI: Head turning and shoulder shrug are intact  MOTOR: Examination was taken 1 hour  after Mestinon 60 mg No significant neck flexion, bilateral upper or lower extremity muscle weakness noted,  REFLEXES: Reflexes are 2+ and symmetric at the biceps, triceps, knees, and ankles. Plantar responses are flexor.  SENSORY: Intact to light touch  COORDINATION: There is no trunk or limb dysmetria noted.  GAIT/STANCE: He can get up from low squatting position holding her daughter  DIAGNOSTIC DATA (LABS, IMAGING, TESTING) - I reviewed patient records, labs, notes, testing and imaging myself where available.   ASSESSMENT AND PLAN  Zacchaeus Halm. is a 24 y.o. male   Seropositive generalized myasthenia  gravis  CT chest showed no thymus pathology  He responded very well to prednisone, and CellCept treatment,  Prednisone treatment since January 06, 2020, 60 mg to begin with, tapered down to 40 mg since January 14, 2020, maintained on prednisone 10 mg daily,  CellCept since March 2021, he is tolerating well, keep 500 mg 3 tablets twice a day,   Mestinon 60 mg to  4 times a day since January 26, 2020, keep Mestinon 3-4 times a day  IVIG: He was given IVIG in July 2021, could not tolerate it developed severe headache during first dose IV infusion, continued throughout the day, recurrent headache-second trial, will stop IVIG   Continue has recurrent mild bulbar, neck proximal limb muscle weakness without Mestinon, could not tolerate IVIG, will look into possibility of Soliris infusion, he wants to hold off, he is at high range for CellCept,  He may want to try higher dose of prednisone, 20 mg daily for 2 weeks, and 15 mg daily for 2 weeks, then taper down to 10 mg again, to see if he can have his myasthenia gravis symptoms under better control, hope to gradually taper down the Mestinon use.  Laboratory evaluation today,        Levert Feinstein, M.D. Ph.D.  [facility] [address] [place], [place] [address] Ph: [phone] Fax: [phone]   CC: Referring Provider

## 2020-09-06 LAB — CBC WITH DIFFERENTIAL
Basophils Absolute: 0 10*3/uL (ref 0.0–0.2)
Basos: 0 %
EOS (ABSOLUTE): 0 10*3/uL (ref 0.0–0.4)
Eos: 1 %
Hematocrit: 48.4 % (ref 37.5–51.0)
Hemoglobin: 16 g/dL (ref 13.0–17.7)
Immature Grans (Abs): 0.1 10*3/uL (ref 0.0–0.1)
Immature Granulocytes: 1 %
Lymphocytes Absolute: 1.5 10*3/uL (ref 0.7–3.1)
Lymphs: 20 %
MCH: 30.8 pg (ref 26.6–33.0)
MCHC: 33.1 g/dL (ref 31.5–35.7)
MCV: 93 fL (ref 79–97)
Monocytes Absolute: 0.9 10*3/uL (ref 0.1–0.9)
Monocytes: 11 %
Neutrophils Absolute: 5 10*3/uL (ref 1.4–7.0)
Neutrophils: 67 %
RBC: 5.2 x10E6/uL (ref 4.14–5.80)
RDW: 12.4 % (ref 11.6–15.4)
WBC: 7.5 10*3/uL (ref 3.4–10.8)

## 2020-09-06 LAB — COMPREHENSIVE METABOLIC PANEL
ALT: 31 IU/L (ref 0–44)
AST: 17 IU/L (ref 0–40)
Albumin/Globulin Ratio: 1.6 (ref 1.2–2.2)
Albumin: 4.9 g/dL (ref 4.1–5.2)
Alkaline Phosphatase: 82 IU/L (ref 44–121)
BUN/Creatinine Ratio: 13 (ref 9–20)
BUN: 11 mg/dL (ref 6–20)
Bilirubin Total: <0.2 mg/dL (ref 0.0–1.2)
CO2: 23 mmol/L (ref 20–29)
Calcium: 9.5 mg/dL (ref 8.7–10.2)
Chloride: 100 mmol/L (ref 96–106)
Creatinine, Ser: 0.82 mg/dL (ref 0.76–1.27)
GFR calc Af Amer: 144 mL/min/{1.73_m2} (ref >59)
GFR calc non Af Amer: 125 mL/min/{1.73_m2} (ref >59)
Globulin, Total: 3 g/dL (ref 1.5–4.5)
Glucose: 87 mg/dL (ref 65–99)
Potassium: 4.3 mmol/L (ref 3.5–5.2)
Sodium: 138 mmol/L (ref 134–144)
Total Protein: 7.9 g/dL (ref 6.0–8.5)

## 2020-09-06 LAB — CK: Total CK: 312 U/L (ref 49–439)

## 2020-09-14 ENCOUNTER — Other Ambulatory Visit: Payer: Self-pay | Admitting: Neurology

## 2020-09-14 DIAGNOSIS — G7000 Myasthenia gravis without (acute) exacerbation: Secondary | ICD-10-CM

## 2020-09-14 MED ORDER — PREDNISONE 10 MG PO TABS: ORAL_TABLET | ORAL | 6 refills | Status: DC

## 2020-10-20 ENCOUNTER — Ambulatory Visit: Payer: Managed Care, Other (non HMO) | Admitting: Family Medicine

## 2020-12-14 ENCOUNTER — Ambulatory Visit: Payer: Managed Care, Other (non HMO) | Admitting: Neurology

## 2020-12-22 ENCOUNTER — Other Ambulatory Visit: Payer: Self-pay

## 2020-12-22 ENCOUNTER — Encounter: Payer: Self-pay | Admitting: Emergency Medicine

## 2020-12-22 ENCOUNTER — Ambulatory Visit
Admission: EM | Admit: 2020-12-22 | Discharge: 2020-12-22 | Disposition: A | Discharge status: Home / Self Care | Payer: Managed Care, Other (non HMO) | Attending: Emergency Medicine | Admitting: Emergency Medicine

## 2020-12-22 DIAGNOSIS — K219 Gastro-esophageal reflux disease without esophagitis: Secondary | ICD-10-CM

## 2020-12-22 DIAGNOSIS — R058 Other specified cough: Secondary | ICD-10-CM | POA: Diagnosis not present

## 2020-12-22 DIAGNOSIS — Z20822 Contact with and (suspected) exposure to covid-19: Secondary | ICD-10-CM | POA: Diagnosis not present

## 2020-12-22 MED ORDER — FAMOTIDINE 40 MG PO TABS: 40.0000 mg | ORAL_TABLET | Freq: Every day | ORAL | 0 refills | Status: DC

## 2020-12-22 NOTE — ED Triage Notes (Signed)
Pt said he has been having cough, congestion x 2 days. Pt said wife is positive for Covid

## 2020-12-22 NOTE — ED Provider Notes (Signed)
[facility]    CSN: [id number] Arrival date & time: 12/22/20  0947      History   Chief Complaint Chief Complaint  Patient presents with  . Cough  . Fatigue    HPI Dylan [name]. is a 25 y.o. male  Presenting for Covid testing: Exposure: wife Date of exposure: cohabitate Any fever, symptoms since exposure: yes - dry cough, congestion x 2 days.  No fever, CP, ShOB.  Past Medical History:  Diagnosis Date  . HyperCKemia   . MG (myasthenia gravis) [facility])     Patient Active Problem List   Diagnosis Date Noted  . Neck pain 09/05/2020  . Myasthenia gravis (HCC) 01/06/2020  . Neuromuscular disorder (HCC) 01/06/2020  . Weakness 01/06/2020  . HyperCKemia   . Fall 06/22/2019  . Pain of right thumb 05/26/2018  . Elevated CK 02/17/2018  . Elevated sed rate 02/17/2018  . Allergic rhinitis 02/17/2018  . Acute maxillary sinusitis 02/17/2018  . Muscle weakness 01/19/2018  . Dry mouth 01/19/2018  . Congestion of throat 01/19/2018  . Hoarse 01/19/2018    Past Surgical History:  Procedure Laterality Date  . APPENDECTOMY    . LAPAROSCOPIC APPENDECTOMY N/A 01/10/2017   Procedure: LAPAROSCOPIC APPENDECTOMY;  Surgeon: Gaynelle Adu, MD;  Location: [facility] OR;  Service: General;  Laterality: N/A;       Home Medications    Prior to Admission medications   Medication Sig Start Date End Date Taking? Authorizing Provider  famotidine (PEPCID) 40 MG tablet Take 1 tablet (40 mg total) by mouth daily. 12/22/20  Yes Hall-Potvin, [place], PA-C  DULoxetine (CYMBALTA) 30 MG capsule Take 1 capsule (30 mg total) by mouth daily. 09/05/20   Levert Feinstein, MD  meloxicam (MOBIC) 15 MG tablet Take 1 tablet (15 mg total) by mouth daily. 09/05/20   Levert Feinstein, MD  mycophenolate (CELLCEPT) 500 MG tablet Take 3 tablets (1,500 mg total) by mouth 2 (two) times daily. 09/05/20   Levert Feinstein, MD  predniSONE (DELTASONE) 10 MG tablet Take 1 tablet (10 mg total) by mouth daily with breakfast.  09/05/20   Levert Feinstein, MD  predniSONE (DELTASONE) 10 MG tablet 10mg  6 tablets x2 weeks; 5 tablets x2 weeks, 4 tablets x2 weeks; 3 tabs x 2 weeks; 2 tabs daily 09/14/20   11/14/20, MD  pyridostigmine (MESTINON) 60 MG tablet Take 1 tablet (60 mg total) by mouth 4 (four) times daily. 09/05/20   09/07/20, MD  SUMAtriptan (IMITREX) 50 MG tablet Take 1 tab at onset of migraine.  May repeat in 2 hrs, if needed.  Max dose: 2 tabs/day. This is a 30 day prescription. 06/29/20   07/01/20, MD    Family History Family History  Problem Relation Age of Onset  . Carpal tunnel syndrome Mother   . Diabetes Father   . Cancer Paternal Aunt        breast  . Stroke Neg Hx   . Heart disease Neg Hx   . Muscular dystrophy Neg Hx     Social History Social History   Tobacco Use  . Smoking status: Never Smoker  . Smokeless tobacco: Never Used  Vaping Use  . Vaping Use: Never used  Substance Use Topics  . Alcohol use: No  . Drug use: Not Currently    Comment: 4-5 use/week     Allergies   Patient has no known allergies.   Review of Systems Review of Systems  Constitutional: Negative for fatigue and fever.  HENT:  Positive for congestion. Negative for dental problem, ear pain, facial swelling, hearing loss, sinus pain, sore throat, trouble swallowing and voice change.   Eyes: Negative for photophobia, pain and visual disturbance.  Respiratory: Positive for cough. Negative for shortness of breath.   Cardiovascular: Negative for chest pain and palpitations.  Gastrointestinal: Negative for diarrhea, nausea and vomiting.  Musculoskeletal: Negative for arthralgias and myalgias.  Neurological: Negative for dizziness and headaches.     Physical Exam Triage Vital Signs ED Triage Vitals  Enc Vitals Group     BP 12/22/20 1057 122/76     Pulse Rate 12/22/20 1057 89     Resp 12/22/20 1057 16     Temp 12/22/20 1057 98.5 F (36.9 C)     Temp Source 12/22/20 1057 Oral     SpO2 12/22/20 1057 95 %      Weight --      Height --      Head Circumference --      Peak Flow --      Pain Score 12/22/20 1056 0     Pain Loc --      Pain Edu? --      Excl. in GC? --    No data found.  Updated Vital Signs BP 122/76 (BP Location: Right Arm)   Pulse 89   Temp 98.5 F (36.9 C) (Oral)   Resp 16   SpO2 95%   Visual Acuity Right Eye Distance:   Left Eye Distance:   Bilateral Distance:    Right Eye Near:   Left Eye Near:    Bilateral Near:     Physical Exam   UC Treatments / Results  Labs (all labs ordered are listed, but only abnormal results are displayed) Labs Reviewed  NOVEL CORONAVIRUS, NAA    EKG   Radiology No results found.  Procedures Procedures (including critical care time)  Medications Ordered in UC Medications - No data to display  Initial Impression / Assessment and Plan / UC Course  I have reviewed the triage vital signs and the nursing notes.  Pertinent labs & imaging results that were available during my care of the patient were reviewed by me and considered in my medical decision making (see chart for details).     Patient afebrile, nontoxic, with SpO2 95%.  Covid PCR pending.  Patient to quarantine until results are back.  We will treat supportively as outlined below; refill of H2 blocker sent per pt request for GERD.  Return precautions discussed, patient verbalized understanding and is agreeable to plan. Final Clinical Impressions(s) / UC Diagnoses   Final diagnoses:  Encounter for screening laboratory testing for COVID-19 virus  Cough with exposure to COVID-19 virus  Gastroesophageal reflux disease, unspecified whether esophagitis present   Discharge Instructions   None    ED Prescriptions    Medication Sig Dispense Auth. Provider   famotidine (PEPCID) 40 MG tablet Take 1 tablet (40 mg total) by mouth daily. 30 tablet Hall-Potvin, [place], PA-C     PDMP not reviewed this encounter.   Hall-Potvin, [place], [place] 12/22/20 1138

## 2020-12-25 LAB — NOVEL CORONAVIRUS, NAA: SARS-CoV-2, NAA: DETECTED — AB

## 2021-01-09 ENCOUNTER — Telehealth: Payer: Self-pay | Admitting: Neurology

## 2021-01-09 ENCOUNTER — Ambulatory Visit: Payer: Managed Care, Other (non HMO) | Admitting: Neurology

## 2021-01-09 VITALS — BP 135/70 | HR 90 | Ht 69.0 in | Wt 249.0 lb

## 2021-01-09 DIAGNOSIS — G7000 Myasthenia gravis without (acute) exacerbation: Secondary | ICD-10-CM | POA: Diagnosis not present

## 2021-01-09 MED ORDER — PREDNISONE 10 MG PO TABS: ORAL_TABLET | ORAL | 6 refills | Status: DC

## 2021-01-09 NOTE — Telephone Encounter (Signed)
Please call patient about prednisone tapering  He is currently taking prednisone 10 mg 3 tablets every morning, for 2 weeks Then taper down to 10 mg 2 and half tablets every morning for 2 weeks Stay on 10 mg 2 tablets every morning until next visit

## 2021-01-09 NOTE — Progress Notes (Signed)
PATIENT: Dylan [name]. DOB: 1996/01/25  Chief Complaint  Patient presents with  . Follow-up    New room, alone. Last seen 09/05/20. MG follow up. On prednisone, cellcept, mestinon.  Currently taking prednisone 10mg  (3 tabs total per day). Doing well, denies any new sx.     HISTORICAL  Dylan [name]. is a 25 year old male, seen in request by my colleague Dr. 30 for evaluation of myasthenia gravis, initial evaluation was on January 06, 2020.  I have reviewed and summarized the referring note from the referring physician.  He was seen by Dr. January 08, 2020 on January 2021, complained of 2 years history of muscle weakness, laboratory evaluation showed positive acetylcholine blocking antibody, binding antibody with titer of 7.58, mild elevated CPK 457  He used to play sports regularly, basketball, wrestling, and workout regularly, in August 2018, when he went back to workout after a month off, he noticed generalized weakness, difficulty moving around, difficulty lifting weight, his weakness intermittent, but overall gradually getting worse over the past 2 years, sometimes he is so weak to the point of could not lift a soda can, has difficulty walking, fell to the floor, could not get himself up, he denies significant ptosis, diplopia, denies swallowing difficulty, does feel short winded with minimum exertion,  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, 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, 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, clinical He denies sensory loss, he complains of diffuse muscle achy pain  UPDATE Jan 26 2020: He is taking prednisone 40 mg daily, noticed mild improvement while taking 60 mg daily, Mestinon 60 mg 3 times daily has helped his symptoms, but he still has intermittent weakness, "crushed after a while", he denies swallowing difficulty, no breathing difficulty, felt chest pressure sometimes  I personally reviewed CT chest, no thymus pathology noted  MRI of the brain  and cervical spine showed no significant abnormality  Laboratory evaluations, positive acetylcholine receptor binding, blocking, and modulating antibodies, elevated SSA 5.9, normal and negative RPR, hepatitis C, rheumatoid factor, heavy metal, vitamin B1, 6, methylmalonic acid level, A1c, lactic acid, , magnesium, TSH, HIV, B12,  Normal IgA level, mild elevated IgG 1793,  UPDATE February 23 2020: He was started on prednisone 60 mg since January 06, 2020, has been on prednisone 40 mg daily since January 24, 2020, tolerating it well, on higher dose of Mestinon 60 mg 4 times a day, complains of bloating, I have prescribed CellCept 1500 mg twice a day, still in the process of preauthorization, IVIG still pending  If he misses Mestinon, he would have significant weakness, he describes 1 morning when he hold his toddler, he fell to the ground, could not get up, now he learned to time his Mestinon to when he needs to be strong, he took first dose 5 AM, able to pick up his daughter sent her to school at 8 AM, then he takes a nap, second dose was around 2 PM, then 5 PM, 9 PM.  UPDATE May 02 2020: He is doing very well, tolerating CellCept 1500 mg twice a day, he missed his IVIG appointment, continue taking prednisone 10 mg 4 tablets every morning, Mestinon has decreased from 4 to 3 tablets each day  He now works at a sitting down job at [employer], complains of mild unsteady gait, occasionally bilateral upper extremity weakness, he denies droopy eyelid, double vision, swallowing difficulty, or chewing difficulty, he denies difficulty breathing  He reported his only had 60% of his  past despite CellCept 1500 mg daily, prednisone 40 mg daily since February  UPDATE July 03 2020: He return for IVIG treatment, complains of significant headache, during the initial days of IVIG infusion, which still present today, moderate to severe  His myasthenia gravis symptoms overall is under good control with current medications,  CellCept 500 mg 3 tablets twice a day, taper down prednisone to 10 mg now, Mestinon 60 mg 3 to 4 tablets daily, he denies significant limitation in daily function  UPDATE Sept 28 2021: Patient is 53 years old daughter during today's interview, exam was taken 1 hour after Mestinon, there was no significant bulbar, or limb muscle weakness noted  Despite CellCept 500 mg 3 tablets twice a day, prednisone 10 mg daily, he still needs Mestinon 60 mg 4 times a day, if he misses a dose, sometimes he felt difficulty holding his daughter, and also weakness in his tongue,  We attempted IVIG treatment on July 03, 2020, he developed severe headache neck stiffness following the first infusion, similar presentation at the beginning of second infusion, we have to abort the IVIG treatment.  He reported ever since then, he noticed intermittent neck pain, radiating stiffness pain to bilateral shoulder.  UPDATE Jan 09 2021: It took prednisone 10 mg 6 tablets every morning from last visit in September 2021, only in January 2022, he began slow tapering at 10 mg decrement every 2 weeks, He began prednisone 10 mg 3 tablets since January 07, 2021, with decrease of prednisone, he felt fatigue, muscle weakness, locking up, he denies double vision, no swallowing difficulty,  he continued his CellCept 1500 mg twice a day, continue to require Mestinon 60 mg 3-4 times a day,  Today's examination was taken 2 hours after last dose of Mestinon, he was found to have mild bilateral shoulder abduction, external rotation weakness, slight bilateral hip flexion weakness  We also performed myasthenia gravis activity of daily living (MG-ADL), total score was 12 Talking, intermittent slurred, fatigue with soft food, swallowing rare episode of choking, breathing, shortness of breath at rest sometimes, impairment of ability to brush teeth of comb hair, rest. Needed occasionally, impairment of ability to rise from a chair, moderate, always use  arms, double vision, none, droopy eyelid, daily, but not constant  Clinical classification is IIIA, predominantly affecting neck, axilla muscles, lesser involvement of oropharyngeal, respiratory muscles.  REVIEW OF SYSTEMS: Full 14 system review of systems performed and notable only for as above All other review of systems were negative.  ALLERGIES: No Known Allergies  HOME MEDICATIONS: Current Outpatient Medications  Medication Sig Dispense Refill  . mycophenolate (CELLCEPT) 500 MG tablet Take 3 tablets (1,500 mg total) by mouth 2 (two) times daily. 540 tablet 4  . predniSONE (DELTASONE) 10 MG tablet 10mg  6 tablets x2 weeks; 5 tablets x2 weeks, 4 tablets x2 weeks; 3 tabs x 2 weeks; 2 tabs daily 180 tablet 6  . pyridostigmine (MESTINON) 60 MG tablet Take 1 tablet (60 mg total) by mouth 4 (four) times daily. 120 tablet 11   No current facility-administered medications for this visit.    PAST MEDICAL HISTORY: Past Medical History:  Diagnosis Date  . HyperCKemia   . MG (myasthenia gravis) (HCC)     PAST SURGICAL HISTORY: Past Surgical History:  Procedure Laterality Date  . APPENDECTOMY    . LAPAROSCOPIC APPENDECTOMY N/A 01/10/2017   Procedure: LAPAROSCOPIC APPENDECTOMY;  Surgeon: 03/10/2017, MD;  [facility];  Service: General;  Laterality: N/A;    FAMILY  HISTORY: Family History  Problem Relation Age of Onset  . Carpal tunnel syndrome Mother   . Diabetes Father   . Cancer Paternal Aunt        breast  . Stroke Neg Hx   . Heart disease Neg Hx   . Muscular dystrophy Neg Hx     SOCIAL HISTORY: Social History   Socioeconomic History  . Marital status: Single    Spouse name: Not on file  . Number of children: 1  . Years of education: Not on file  . Highest education level: Some college, no degree  Occupational History  . Not on file  Tobacco Use  . Smoking status: Never Smoker  . Smokeless tobacco: Never Used  Vaping Use  . Vaping Use: Never used  Substance  and Sexual Activity  . Alcohol use: No  . Drug use: Not Currently    Comment: 4-5 use/week  . Sexual activity: Not on file  Other Topics Concern  . Not on file  Social History Narrative   Lives at home with his girlfriend and daughter   Right handed   Caffeine: 2 cans/day of soda off and on   Social Determinants of Health   Financial Resource Strain: Not on file  Food Insecurity: Not on file  Transportation Needs: Not on file  Physical Activity: Not on file  Stress: Not on file  Social Connections: Not on file  Intimate Partner Violence: Not on file     PHYSICAL EXAM   Vitals:   01/09/21 1021  BP: 135/70  Pulse: 90  SpO2: 98%  Weight: 249 lb (112.9 kg)  Height: 5\' 9"  (1.753 m)   Not recorded     Body mass index is 36.77 kg/m.  PHYSICAL EXAMNIATION:  Gen: NAD, conversant, well nourised, well groomed  NEUROLOGICAL EXAM:  MENTAL STATUS: Speech/cognition: Awake alert oriented to history taking care of conversation   CRANIAL NERVES: CN II: Visual fields are full to confrontation. Pupils are round equal and briskly reactive to light. CN III, IV, VI: extraocular movement are normal. No ptosis. CN V: Facial sensation is intact to light touch CN VII: He has no significant eye closure weakness, mild cheek puff weakness. CN VIII: Hearing is normal to causal conversation. CN IX, X: Phonation is normal. CN XI: Head turning and shoulder shrug are intact  MOTOR: Examination was taken 2 hour  after Mestinon 60 mg He has mild bilateral shoulder abduction, external rotation, slight bilateral hip flexion weakness  REFLEXES: Reflexes are 2+ and symmetric at the biceps, triceps, knees, and ankles. Plantar responses are flexor.  SENSORY: Intact to light touch  COORDINATION: There is no trunk or limb dysmetria noted.  GAIT/STANCE: He can get up from low squatting position without pushing on the floor  DIAGNOSTIC DATA (LABS, IMAGING, TESTING) - I reviewed patient  records, labs, notes, testing and imaging myself where available.   ASSESSMENT AND PLAN  Trystyn Armond. is a 25 y.o. male   Seropositive generalized myasthenia gravis  Acetylcholine receptor binding antibody was +7.58 on December 29, 2019, acetylcholine blocking antibody was +34  CT chest showed no thymus pathology  He responded very well to prednisone, and CellCept treatment,  Prednisone treatment since January 06, 2020, 60 mg to begin with,    CellCept since March 2021, he is tolerating well, keep 500 mg 3 tablets twice a day,   Mestinon 60 mg to 4 times a day since January 26, 2020, keep Mestinon 3-4 times a day  IVIG: He was given IVIG in July 2021, could not tolerate it developed severe headache during first dose IV infusion, continued throughout the day, recurrent headache-second trial, will stop IVIG   Patient is currently on high dose of CellCept 500 mg 3 tablets twice a day, he responded well to prednisone treatment, but whenever prednisone dosage was decreased to below 30 mg daily, he would develop proximal muscle weakness, despite Mestinon 3 to 4 tablets daily  He could not tolerate previous IVIG treatment due to intolerable headache,  Will try Soliris,  activity of daily living (MG-ADL), total score was 12 Talking, intermittent slurred, fatigue with soft food, swallowing rare episode of choking, breathing, shortness of breath at rest sometimes, impairment of ability to brush teeth of comb hair, rest. Needed occasionally, impairment of ability to rise from a chair, moderate, always use arms, double vision, none, droopy eyelid, daily, but not constant  Clinical classification is IIIA, predominantly affecting neck, axilla muscles, lesser involvement of oropharyngeal, respiratory muscles.  Meningococcal vaccination   Continue slow tapering of prednisone 30 mg daily for 2 weeks, 25 mg for 2 weeks, stay at 20 mg daily until further instruction at next follow-up visit     Total time spent reviewing the chart, obtaining history, examined patient, ordering tests, documentation, consultations and family, care coordination was 60 minutes       Levert Feinstein, M.D. Ph.D.  [facility] [address], [address] [place], [place] [address] Ph: [phone] Fax: [phone]   CC: Referring Provider

## 2021-01-10 ENCOUNTER — Telehealth: Payer: Self-pay | Admitting: Neurology

## 2021-01-10 NOTE — Telephone Encounter (Signed)
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 She states the one she was given was [place] & not [place].  Best contact: [phone]

## 2021-01-10 NOTE — Telephone Encounter (Signed)
I spoke to the patient and he verbalized understanding of the Prednisone dosing instructions.  I also let him know that [facility] is working with his insurance to get [employer] approved. He still needs to get his meningococcal vaccinations. I ask him to call me or send a mychart message with the dates of both vaccines, once completed.   Per Dr. Terrace Arabia, his vaccines will be one month apart and he may start the infusion two weeks after his second dose.  I provided him with [facility]' case manager's phone number Marisue Ivan #[phone]). [facility] has patient assistance programs for the vaccines and infusion. He will give her call to see if he qualifies.

## 2021-01-10 NOTE — Telephone Encounter (Signed)
I spoke to Dylan [name]. The new form has been completed and faxed back to her. She is going to contact the patient for his signatures.

## 2021-01-10 NOTE — Telephone Encounter (Signed)
Left voicemail requesting a return call.

## 2021-01-17 NOTE — Telephone Encounter (Signed)
Marisue Ivan has spoken to the patient to obtain his signatures. She is working with him on assistance for his vaccinations. She has also enrolled him into the [employer] program to help with the cost of the Soliris infusions.  I have provided this update to [facility].

## 2021-01-24 ENCOUNTER — Encounter: Payer: Self-pay | Admitting: *Deleted

## 2021-01-24 NOTE — Telephone Encounter (Addendum)
Dylan [name] called with update, stated they cannot reach patient.  She asked me to reach out. He needs to call [name] [phone] to schedule his vaccinations. If she doesn't answer he needs to LVM as best time to reach him. I advised her I will call and send my chart to him. Dylan [name] verbalized understanding, appreciation. My chart sent. LVM advising him to call [name], repeated her name, # twice, advised I sent my chart with information as well,  left our # for questions.

## 2021-01-31 ENCOUNTER — Telehealth: Payer: Self-pay | Admitting: *Deleted

## 2021-01-31 NOTE — Telephone Encounter (Signed)
I spoke to the patient. He informed me he is scheduled for his first meningococcal vaccination on 02/02/21. He will let us know the appt for the second vaccination once the appt is made.  I also provided [facility] with this update.

## 2021-02-06 NOTE — Telephone Encounter (Signed)
I received his vaccine administration records but the documentation is unclear. I have left a message for his [facility] case manager, Willene Hatchet, to return my call. Her contact number is [phone]).

## 2021-02-06 NOTE — Telephone Encounter (Signed)
I received an update from his case manager, Willene Hatchet. He can have his Bexsero booster in one month and it is scheduled for 03/06/21. He can have his Menactra booster in two months and this will be scheduled around 04/02/21. She will confirm with our office when he has completed both boosters. We will then work on getting him scheduled for [facility]ir[facility] with Dr. Zannie Cove clearance.

## 2021-02-06 NOTE — Telephone Encounter (Addendum)
I spoke to Marisue Ivan who states the patient received the first doses of both Menacta and Bexsero doses on 02/02/21. We have a fax confirmation of his vaccine administration. This will be provided to [facility] to assist in getting the medication approved through his insurance plan.   Per guidelines, Marisue Ivan stated he is cleared to start Soliris in 14 days after getting the first doses of his vaccines. He will be getting his booster in two month.  [facility] will work on the approval of Soliris with a target start date of 02/19/21. We will clear this date with Dr. Terrace Arabia first.

## 2021-02-06 NOTE — Telephone Encounter (Signed)
Per vo by Dr. Terrace Arabia, she does not wish for this patient to start Soliris until after he has also received his booster vaccinations of both Menactra and Bexsero. This will be in eight weeks (not yet scheduled). He will continue his oral medication regimen for now. Since he has already received his initial vaccines, [facility] should be able to go ahead and resubmit for insurance approval.   I left the patient a message with this information. Provided our number to call back with any questions.   I also left his casework, Willene Hatchet, a message with the same update.

## 2021-02-06 NOTE — Telephone Encounter (Signed)
He needs to complete his meningococcal vaccine, including second dose (weeks apart from the first dose ) before proceeding with Soliris infusion

## 2021-03-09 NOTE — Telephone Encounter (Signed)
Received Bexsero booster on 03/09/21. Provided information to [facility].

## 2021-03-17 ENCOUNTER — Telehealth: Payer: Self-pay | Admitting: Neurology

## 2021-03-17 ENCOUNTER — Other Ambulatory Visit: Payer: Self-pay | Admitting: Neurology

## 2021-03-17 NOTE — Telephone Encounter (Signed)
Wife called, he is not doing well, nausea, cramping, diarrhea. He has never had this reaction to his Solaris infusions he also just had a vaccine. This could be just a GI virus or something he ate however I recommend he go to ED to be evaluated, this is not usual for him and given he is taking multiple immunosuppresants they should at least have some labs completed. Also worry about myasthenic crisis. They agreed to go to the ED, I recommended they call [facility] [facility] [place] to see what the waiting times are, I have heard [place] is the quickest. They are welcome to contact me back if needed.

## 2021-03-17 NOTE — Progress Notes (Signed)
Wife called, he is not doing well, nausea, cramping, diarrhea. He has never had this reaction to his Solaris infusions he also just had a vaccine. This ould be just a GI virus or something he ate however I recommend he go to ED to be evaluated, this is not usual for him and given he is taking multiple immunosuppresants they should at least have some labs completed. They are welcome to contact me back if needed.

## 2021-03-19 NOTE — Telephone Encounter (Signed)
please check on patient to see how is he doing, if needed, add him to my schedule for today

## 2021-03-19 NOTE — Telephone Encounter (Signed)
I spoke to the patient. He did not go to the ED after speaking to Dr. Lucia Gaskins. He went to bed to rest and felt better the following morning. I offered him an appt today but he is unable to get off work. I offered him other time options this week but he declined. Says he does not feel he needs to be seen because his symptoms are no longer present. He has his next Soliris infustion on 03/21/21.

## 2021-04-11 ENCOUNTER — Other Ambulatory Visit: Payer: Self-pay | Admitting: Neurology

## 2021-04-12 ENCOUNTER — Encounter: Payer: Self-pay | Admitting: *Deleted

## 2021-04-12 ENCOUNTER — Telehealth: Payer: Self-pay | Admitting: *Deleted

## 2021-04-12 ENCOUNTER — Encounter: Payer: Self-pay | Admitting: Neurology

## 2021-04-12 ENCOUNTER — Ambulatory Visit: Payer: Managed Care, Other (non HMO) | Admitting: Neurology

## 2021-04-12 VITALS — BP 138/78 | HR 103 | Ht 68.0 in | Wt 243.0 lb

## 2021-04-12 DIAGNOSIS — G7000 Myasthenia gravis without (acute) exacerbation: Secondary | ICD-10-CM

## 2021-04-12 MED ORDER — MYCOPHENOLATE MOFETIL 500 MG PO TABS: 1500.0000 mg | ORAL_TABLET | Freq: Two times a day (BID) | ORAL | 4 refills | Status: DC

## 2021-04-12 MED ORDER — PREDNISONE 5 MG PO TABS: 10.0000 mg | ORAL_TABLET | Freq: Every day | ORAL | 6 refills | Status: DC

## 2021-04-12 NOTE — Telephone Encounter (Signed)
Patient informed [facility] today that [facility] can't refill Cellcept. [facility], spoke with Weston Brass who stated they have never filled it; it must come from a specialty pharmacy. I checked and previous Rx were sent to [facility]. I will send Rx to [facility].

## 2021-04-12 NOTE — Progress Notes (Signed)
Chief Complaint  Patient presents with  . Myasthenia [name]    3 month FU "no new concerns"      ASSESSMENT AND PLAN  Dylan [name]. is a 25 y.o. male   Dylan [name]. is a 25 y.o. male   Dylan [name]  Acetylcholine receptor binding antibody was +7.58 on December 29, 2019, acetylcholine blocking antibody was +34  CT chest showed no thymus pathology  He responded very well to prednisone, and CellCept treatment,  Prednisone treatment since January 06, 2020, 60 mg to begin with,    CellCept since March 2021, he is tolerating well, keep 500 mg 3 tablets twice a day,   Mestinon 60 mg to 4 times a day since January 26, 2020, keep Mestinon 3-4 times a day  IVIG: He was given IVIG in July 2021, could not tolerate it developed severe headache during first dose IV infusion, continued throughout the day, recurrent headache-second trial, will stop IVIG  Patient is currently on high dose of CellCept 500 mg 3 tablets twice a day, he responded well to prednisone treatment, but whenever prednisone dosage was decreased to below 30 mg daily, he would develop proximal muscle weakness, despite Mestinon 3 to 4 tablets daily  Solaris was started in March 2022,    600 mg weekly for the first 4 weeks, followed by   900 mg for the fifth dose 1 week later, on May 4th 2022   900 mg every 2 weeks thereafter.  He responded well to Solaris treatment, he takes prednisone 10 mg daily, Mestinon 60 mg twice a day, today's examination was performed 4 hours after morning dose of Mestinon, and he has mild bilateral shoulder abduction weakness, no bulbar or lower extremity weakness noted,  Will stay on prednisone 5 mg 2 tablets every morning, then 7.5 mg in June 2022, 5 mg in June 2022, keep current dose of CellCept 50 mg daily, Mestinon as needed  Laboratory evaluations.    DIAGNOSTIC DATA (LABS, IMAGING, TESTING) - I reviewed patient records, labs, notes, testing and  imaging myself where available. CT of the chest in February 2021 was normal, no evidence of thymic pathology  MRI of the brain with without contrast February 2021 was normal  Positive acetylcholine antibody in January 2021, binding antibody 7.58, modulating antibody 38, blocking antibody 34,  HISTORICAL Dylan [name]. is a 25 year old male, seen in request by my colleague Dr. Lucia Gaskins for evaluation of myasthenia [name], initial evaluation was on January 06, 2020.  I have reviewed and summarized the referring note from the referring physician.  He was seen by Dr. Lucia Gaskins on January 2021, complained of 2 years history of muscle weakness, laboratory evaluation showed positive acetylcholine blocking antibody, binding antibody with titer of 7.58, mild elevated CPK 457  He used to play sports regularly, basketball, wrestling, and workout regularly, in August 2018, when he went back to workout after a month off, he noticed generalized weakness, difficulty moving around, difficulty lifting weight, his weakness intermittent, but overall gradually getting worse over the past 2 years, sometimes he is so weak to the point of could not lift a soda can, has difficulty walking, fell to the floor, could not get himself up, he denies significant ptosis, diplopia, denies swallowing difficulty, does feel short winded with minimum exertion,  He works as a Psychologist, clinical delivery person, 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 He denies sensory loss, he complains of  diffuse muscle achy pain  UPDATE Jan 26 2020: He is taking prednisone 40 mg daily, noticed mild improvement while taking 60 mg daily, Mestinon 60 mg 3 times daily has helped his symptoms, but he still has intermittent weakness, "crushed after a while", he denies swallowing difficulty, no breathing difficulty, felt chest pressure sometimes  I personally reviewed CT chest, no thymus pathology noted  MRI of the brain and  cervical spine showed no significant abnormality  Laboratory evaluations, positive acetylcholine receptor binding, blocking, and modulating antibodies, elevated SSA 5.9, normal and negative RPR, hepatitis C, rheumatoid factor, heavy metal, vitamin B1, 6, methylmalonic acid level, A1c, lactic acid, , magnesium, TSH, HIV, B12,  Normal IgA level, mild elevated IgG 1793,  UPDATE February 23 2020: He was started on prednisone 60 mg since January 06, 2020, has been on prednisone 40 mg daily since January 24, 2020, tolerating it well, on higher dose of Mestinon 60 mg 4 times a day, complains of bloating, I have prescribed CellCept 1500 mg twice a day, still in the process of preauthorization, IVIG still pending  If he misses Mestinon, he would have significant weakness, he describes 1 morning when he hold his toddler, he fell to the ground, could not get up, now he learned to time his Mestinon to when he needs to be strong, he took first dose 5 AM, able to pick up his daughter sent her to school at 8 AM, then he takes a nap, second dose was around 2 PM, then 5 PM, 9 PM.  UPDATE May 02 2020: He is doing very well, tolerating CellCept 1500 mg twice a day, he missed his IVIG appointment, continue taking prednisone 10 mg 4 tablets every morning, Mestinon has decreased from 4 to 3 tablets each day  He now works at a sitting down job at [employer], complains of mild unsteady gait, occasionally bilateral upper extremity weakness, he denies droopy eyelid, double vision, swallowing difficulty, or chewing difficulty, he denies difficulty breathing  He reported his only had 60% of his past despite CellCept 1500 mg daily, prednisone 40 mg daily since February  UPDATE July 03 2020: He return for IVIG treatment, complains of significant headache, during the initial days of IVIG infusion, which still present today, moderate to severe  His myasthenia [name] symptoms overall is under good control with current medications,  CellCept 500 mg 3 tablets twice a day, taper down prednisone to 10 mg now, Mestinon 60 mg 3 to 4 tablets daily, he denies significant limitation in daily function  UPDATE Sept 28 2021: Patient is 40 years old daughter during today's interview, exam was taken 1 hour after Mestinon, there was no significant bulbar, or limb muscle weakness noted  Despite CellCept 500 mg 3 tablets twice a day, prednisone 10 mg daily, he still needs Mestinon 60 mg 4 times a day, if he misses a dose, sometimes he felt difficulty holding his daughter, and also weakness in his tongue,  We attempted IVIG treatment on July 03, 2020, he developed severe headache neck stiffness following the first infusion, similar presentation at the beginning of second infusion, we have to abort the IVIG treatment.  He reported ever since then, he noticed intermittent neck pain, radiating stiffness pain to bilateral shoulder.  UPDATE Jan 09 2021: It took prednisone 10 mg 6 tablets every morning from last visit in September 2021, only in January 2022, he began slow tapering at 10 mg decrement every 2 weeks, He began prednisone 10 mg 3 tablets since  January 07, 2021, with decrease of prednisone, he felt fatigue, muscle weakness, locking up, he denies double vision, no swallowing difficulty,  he continued his CellCept 1500 mg twice a day, continue to require Mestinon 60 mg 3-4 times a day,  Today's examination was taken 2 hours after last dose of Mestinon, he was found to have mild bilateral shoulder abduction, external rotation weakness, slight bilateral hip flexion weakness  We also performed myasthenia [name] activity of daily living (MG-ADL), total score was 12 Talking, intermittent slurred, fatigue with soft food, swallowing rare episode of choking, breathing, shortness of breath at rest sometimes, impairment of ability to brush teeth of comb hair, rest. Needed occasionally, impairment of ability to rise from a chair, moderate, always use  arms, double vision, none, droopy eyelid, daily, but not constant  Clinical classification is IIIA, predominantly affecting neck, axilla muscles, lesser involvement of oropharyngeal, respiratory muscles  UPDATE Apr 12 2021: He started Solaris treatment in March 2022, tolerating it very well, it has really controlled his muscle weakness, he is only taking Mestinon 60 mg twice a day, on prednisone 10 mg daily, CellCept 1500 mg twice a day,  REVIEW OF SYSTEMS:  Full 14 system review of systems performed and notable only for as above All other review of systems were negative.  PHYSICAL EXAM:   Vitals:   04/12/21 1016  BP: 138/78  Pulse: (!) 103  Weight: 243 lb (110.2 kg)  Height: 5\' 8"  (1.727 m)   Not recorded     Body mass index is 36.95 kg/m.  PHYSICAL EXAMNIATION:  Gen: NAD, conversant, well nourised, well groomed              NEUROLOGICAL EXAM:  MENTAL STATUS: Speech/cognition:    Awake, alert, oriented to history taking and casual conversation CRANIAL NERVES: CN II: Visual fields are full to confrontation. Pupils are round equal and briskly reactive to light. CN III, IV, VI: extraocular movement are normal. No ptosis. CN V: Facial sensation is intact to light touch CN VII: Face is symmetric with normal eye closure  CN VIII: Hearing is normal to causal conversation. CN IX, X: Phonation is normal. CN XI: Head turning and shoulder shrug are intact  MOTOR: Mild bilateral shoulder abduction weakness, there was no neck flexion, distal upper extremity weakness, there was no lower extremity weakness.  REFLEXES: Reflexes are 2+ and symmetric at the biceps, triceps, knees, and ankles. Plantar responses are flexor.  SENSORY: Intact to light touch, pinprick and vibratory sensation are intact in fingers and toes.  COORDINATION: There is no trunk or limb dysmetria noted.  GAIT/STANCE: He can get up from low squatting position without difficulty  ALLERGIES: No Known  Allergies  HOME MEDICATIONS: Current Outpatient Medications  Medication Sig Dispense Refill  . mycophenolate (CELLCEPT) 500 MG tablet Take 3 tablets (1,500 mg total) by mouth 2 (two) times daily. 540 tablet 4  . predniSONE (DELTASONE) 10 MG tablet 3 tabs x 2 weeks; 2 tabs daily 90 tablet 6  . pyridostigmine (MESTINON) 60 MG tablet Take 1 tablet (60 mg total) by mouth 4 (four) times daily. 120 tablet 11   No current facility-administered medications for this visit.    PAST MEDICAL HISTORY: Past Medical History:  Diagnosis Date  . HyperCKemia   . MG (myasthenia [name]) (HCC)     PAST SURGICAL HISTORY: Past Surgical History:  Procedure Laterality Date  . LAPAROSCOPIC APPENDECTOMY N/A 01/10/2017   Procedure: LAPAROSCOPIC APPENDECTOMY;  Surgeon: Gaynelle AduEric Wilson, MD;  [facility];  Service: General;  Laterality: N/A;    FAMILY HISTORY: Family History  Problem Relation Age of Onset  . Carpal tunnel syndrome Mother   . Diabetes Father   . Cancer Paternal Aunt        breast  . Stroke Neg Hx   . Heart disease Neg Hx   . Muscular dystrophy Neg Hx     SOCIAL HISTORY: Social History   Socioeconomic History  . Marital status: Single    Spouse name: Not on file  . Number of children: 1  . Years of education: Not on file  . Highest education level: Some college, no degree  Occupational History    Comment: [employer]  Tobacco Use  . Smoking status: Never Smoker  . Smokeless tobacco: Never Used  Vaping Use  . Vaping Use: Never used  Substance and Sexual Activity  . Alcohol use: No  . Drug use: Not Currently    Comment: 4-5 use/week  . Sexual activity: Not on file  Other Topics Concern  . Not on file  Social History Narrative   Lives at home with his girlfriend and daughter   Right handed   Caffeine: 2 cans/day of soda off and on   Social Determinants of Health   Financial Resource Strain: Not on file  Food Insecurity: Not on file  Transportation Needs: Not on file   Physical Activity: Not on file  Stress: Not on file  Social Connections: Not on file  Intimate Partner Violence: Not on file      Levert Feinstein, M.D. Ph.D.  [facility] [address], [address] [place], [place] [address] Ph: [phone] Fax: [phone]  CC:  No referring provider defined for this encounter.  Patient, No Pcp Per (Inactive)

## 2021-04-18 ENCOUNTER — Other Ambulatory Visit: Payer: Self-pay | Admitting: Neurology

## 2021-04-18 ENCOUNTER — Encounter: Payer: Self-pay | Admitting: Neurology

## 2021-04-18 DIAGNOSIS — G7000 Myasthenia gravis without (acute) exacerbation: Secondary | ICD-10-CM

## 2021-04-18 MED ORDER — MYCOPHENOLATE MOFETIL 500 MG PO TABS: 1500.0000 mg | ORAL_TABLET | Freq: Two times a day (BID) | ORAL | 4 refills | Status: DC

## 2021-04-18 NOTE — Addendum Note (Signed)
Addended by: Colen Darling C on: 04/18/2021 11:00 AM   Modules accepted: Orders

## 2021-04-18 NOTE — Telephone Encounter (Signed)
Called patient who stated [facility] was charging him a lot for cellcpet. He has been getting it at [facility] for past year using [facility] card.  I advised will discontinue Rx at [facility] and sent to [facility]. Patient verbalized understanding, appreciation. Rx sent to [facility] as patient requested.

## 2021-04-18 NOTE — Telephone Encounter (Signed)
Pt called, mycophenolate (CELLCEPT) 500 MG tablet was refused at [facility].  I(Annie) informed Pt of previous note, prescription had to be sent to a specialty pharmacy and was sent [facility]. Pt stated, I do not want sent to [facility].  Would like a call from the nurse.

## 2021-05-09 NOTE — Progress Notes (Signed)
Saw patient at infusion May 09, 2021, tolerating Solaris infusion very well, continue showing improvement, examination was done after morning dose of Mestinon, still has mild bilateral shoulder abduction weakness, on prednisone 10 mg daily  Will check patient biweekly basis without morning dose of Mestinon, if he continues to do well, may tapering down prednisone at a slower pace

## 2021-06-06 ENCOUNTER — Telehealth: Payer: Self-pay | Admitting: Neurology

## 2021-06-06 NOTE — Telephone Encounter (Signed)
Dylan [name] continues to do very well with solaris IV infusion,  Keep current dose of CellCept 500 mg 3 tablets twice a day. Decrease prednisone from 10 mg to 5 mg 1+ half tab daily until next infusion. Mestinon as needed

## 2021-06-20 ENCOUNTER — Ambulatory Visit (INDEPENDENT_AMBULATORY_CARE_PROVIDER_SITE_OTHER): Payer: Managed Care, Other (non HMO) | Admitting: Neurology

## 2021-06-20 ENCOUNTER — Encounter: Payer: Self-pay | Admitting: Neurology

## 2021-06-20 VITALS — BP 128/92 | HR 78

## 2021-06-20 DIAGNOSIS — Z79899 Other long term (current) drug therapy: Secondary | ICD-10-CM

## 2021-06-20 DIAGNOSIS — Z7960 Long term (current) use of unspecified immunomodulators and immunosuppressants: Secondary | ICD-10-CM

## 2021-06-20 DIAGNOSIS — G7000 Myasthenia gravis without (acute) exacerbation: Secondary | ICD-10-CM

## 2021-06-20 NOTE — Progress Notes (Signed)
Chief Complaint  Patient presents with   Follow-up    Room 16 - alone.       ASSESSMENT AND PLAN  Dylan [name]. is a 25 y.o. male   [name]. is a 25 y.o. male   Seropositive generalized myasthenia gravis  Acetylcholine receptor binding antibody was +7.58 on December 29, 2019, acetylcholine blocking antibody was +34  CT chest showed no thymus pathology  He responded very well to prednisone, and CellCept treatment,  Prednisone treatment since January 06, 2020, 60 mg to begin with,    CellCept since March 2021, he is tolerating well, keep 500 mg 3 tablets twice a day,   Mestinon 60 mg to 4 times a day since January 26, 2020, keep Mestinon 3-4 times a day  IVIG: He was given IVIG in July 2021, could not tolerate it developed severe headache during first dose IV infusion, continued throughout the day, recurrent headache-second trial, will stop IVIG  Patient is currently on high dose of CellCept 500 mg 3 tablets twice a day, he responded well to prednisone treatment, but whenever prednisone dosage was decreased to below 30 mg daily, he would develop proximal muscle weakness, despite Mestinon 3 to 4 tablets daily  Solaris was started in March 2022,    600 mg weekly for the first 4 weeks, followed by   900 mg for the fifth dose 1 week later, on May 4th 2022   900 mg every 2 weeks thereafter.  He responded very well to Solaris treatment, he takes prednisone 10 mg daily, Mestinon 60 mg twice a    We began prednisone tapering since July, prednisone 5mg  tab 7.5mg  since July 7th 2022.  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 CT of the chest in February 2021 was normal, no evidence of thymic pathology  MRI of the brain with without contrast February 2021 was normal  Positive acetylcholine antibody in January 2021, binding antibody 7.58, modulating antibody 38, blocking antibody 34,  HISTORICAL Dylan [name]. is a 25 year old male, seen in request by my colleague Dr. 30 for evaluation of myasthenia gravis, initial evaluation was on January 06, 2020.  I have reviewed and summarized the referring note from the referring physician.  He was seen by Dr. January 08, 2020 on January 2021, complained of 2 years history of muscle weakness, laboratory evaluation showed positive acetylcholine blocking antibody, binding antibody with titer of 7.58, mild elevated CPK 457  He used to play sports regularly, basketball, wrestling, and workout regularly, in August 2018, when he went back to workout after a month off, he noticed generalized weakness, difficulty moving around, difficulty lifting weight, his weakness intermittent, but overall gradually getting worse over the past 2 years, sometimes he is so weak to the point of could not lift a soda can, has difficulty walking, fell to the floor, could not get himself up, he denies significant ptosis, diplopia, denies swallowing difficulty, does feel short winded with minimum exertion,  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, 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, 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one-story building He denies sensory loss, he complains of diffuse muscle achy pain  UPDATE Jan 26 2020: He is taking prednisone 40 mg daily, noticed mild improvement while taking 60 mg daily, Mestinon 60 mg 3 times daily has helped his symptoms, but he still has intermittent weakness, "crushed after a while", he denies swallowing difficulty, no breathing difficulty, felt chest pressure sometimes  I  personally reviewed CT chest, no thymus pathology noted  MRI of the brain and cervical spine showed no significant abnormality  Laboratory evaluations, positive acetylcholine receptor binding, blocking, and modulating antibodies, elevated SSA 5.9, normal and negative RPR, hepatitis C, rheumatoid factor, heavy metal, vitamin B1, 6, methylmalonic acid level, A1c, lactic acid, , magnesium, TSH, HIV, B12,  Normal IgA level, mild elevated IgG 1793,  UPDATE February 23 2020: He was started on prednisone 60 mg since January 06, 2020, has been on prednisone 40 mg daily since January 24, 2020, tolerating it well, on higher dose of Mestinon 60 mg 4 times a day, complains of bloating, I have prescribed CellCept 1500 mg twice a day, still in the process of preauthorization, IVIG still pending  If he misses Mestinon, he would have significant weakness, he describes 1 morning when he hold his toddler, he fell to the ground, could not get up, now he learned to time his Mestinon to when he needs to be strong, he took first dose 5 AM, able to pick up his daughter sent her to school at 8 AM, then he takes a nap, second dose was around 2 PM, then 5 PM, 9 PM.  UPDATE May 02 2020: He is doing very well, tolerating CellCept 1500 mg twice a day, he missed his IVIG appointment, continue taking prednisone 10 mg 4 tablets every morning, Mestinon has decreased from 4 to 3 tablets each day  He now works at a sitting down job at [employer], complains of mild unsteady gait, occasionally bilateral upper extremity weakness, he denies droopy eyelid, double vision, swallowing difficulty, or chewing difficulty, he denies difficulty breathing  He reported his only had 60% of his past despite CellCept 1500 mg daily, prednisone 40 mg daily since February  UPDATE July 03 2020: He return for IVIG treatment, complains of significant headache, during the initial days of IVIG infusion, which still present today, moderate to severe  His  myasthenia gravis symptoms overall is under good control with current medications, CellCept 500 mg 3 tablets twice a day, taper down prednisone to 10 mg now, Mestinon 60 mg 3 to 4 tablets daily, he denies significant limitation in daily function  UPDATE Sept 28 2021: Patient is 25 years old daughter during today's interview, exam was taken 1 hour after Mestinon, there was no significant bulbar, or limb muscle weakness noted  Despite CellCept 500 mg 3 tablets twice a day, prednisone 10 mg daily, he still needs Mestinon 60 mg 4 times a day, if he misses a dose, sometimes he felt difficulty holding his daughter, and also weakness in his tongue,  We attempted IVIG treatment on July 03, 2020, he developed severe headache neck stiffness following the first infusion, similar presentation at the beginning of second infusion, we have to abort the IVIG treatment.  He reported ever since then, he noticed intermittent neck pain, radiating stiffness pain to bilateral shoulder.  UPDATE Jan 09 2021: It took prednisone 10 mg 6 tablets every morning from last visit in September 2021, only in January 2022, he began  slow tapering at 10 mg decrement every 2 weeks, He began prednisone 10 mg 3 tablets since January 07, 2021, with decrease of prednisone, he felt fatigue, muscle weakness, locking up, he denies double vision, no swallowing difficulty,  he continued his CellCept 1500 mg twice a day, continue to require Mestinon 60 mg 3-4 times a day,  Today's examination was taken 2 hours after last dose of Mestinon, he was found to have mild bilateral shoulder abduction, external rotation weakness, slight bilateral hip flexion weakness  We also performed myasthenia gravis activity of daily living (MG-ADL), total score was 12 Talking, intermittent slurred, fatigue with soft food, swallowing rare episode of choking, breathing, shortness of breath at rest sometimes, impairment of ability to brush teeth of comb hair, rest.  Needed occasionally, impairment of ability to rise from a chair, moderate, always use arms, double vision, none, droopy eyelid, daily, but not constant  Clinical classification is IIIA, predominantly affecting neck, axilla muscles, lesser involvement of oropharyngeal, respiratory muscles  UPDATE Apr 12 2021: He started Solaris treatment in March 2022, tolerating it very well, it has really controlled his muscle weakness, he is only taking Mestinon 60 mg twice a day, on prednisone 10 mg daily, CellCept 1500 mg twice a day,  UPDATE June 20 2021: He has overall been doing very well, since Solaris treatment, he only use Mestinon as needed, maximum 1 dose a day, no double vision, no difficulty breathing, no significant limb muscle weakness, continued CellCept 500 mg 3 tablets twice a day, started prednisone tapering from 10 mg daily to 7.5 mg on June 14, 2021, tolerating stepping down of the dosage, no flareup weakness  He was noted to have mild moon face, weight gain, central obesity REVIEW OF SYSTEMS:  Full 14 system review of systems performed and notable only for as above All other review of systems were negative.  PHYSICAL EXAM:   Vitals:   06/20/21 0843  BP: (!) 128/92  Pulse: 78   Not recorded     There is no height or weight on file to calculate BMI.  PHYSICAL EXAMNIATION:  Gen: NAD, conversant, well nourised, well groomed              NEUROLOGICAL EXAM:  MENTAL STATUS: Speech/cognition:    Awake, alert, oriented to history taking and casual conversation CRANIAL NERVES: CN II: Visual fields are full to confrontation. Pupils are round equal and briskly reactive to light. CN III, IV, VI: extraocular movement are normal. No ptosis. CN V: Facial sensation is intact to light touch CN VII: Face is symmetric with normal eye closure  CN VIII: Hearing is normal to causal conversation. CN IX, X: Phonation is normal. CN XI: Head turning and shoulder shrug are intact  MOTOR: No  neck flexion, extension, or limb muscle weakness noted  REFLEXES: Reflexes are 2+ and symmetric at the biceps, triceps, knees, and ankles. Plantar responses are flexor.  SENSORY: Intact to light touch   COORDINATION: There is no trunk or limb dysmetria noted.  GAIT/STANCE: He can get up from low squatting position without difficulty  ALLERGIES: No Known Allergies  HOME MEDICATIONS: Current Outpatient Medications  Medication Sig Dispense Refill   mycophenolate (CELLCEPT) 500 MG tablet Take 3 tablets (1,500 mg total) by mouth 2 (two) times daily. 540 tablet 4   predniSONE (DELTASONE) 5 MG tablet Take 2 tablets (10 mg total) by mouth daily with breakfast. 60 tablet 6   pyridostigmine (MESTINON) 60 MG tablet Take 1 tablet (60 mg  total) by mouth 4 (four) times daily. 120 tablet 11   No current facility-administered medications for this visit.    PAST MEDICAL HISTORY: Past Medical History:  Diagnosis Date   HyperCKemia    MG (myasthenia gravis) (HCC)     PAST SURGICAL HISTORY: Past Surgical History:  Procedure Laterality Date   LAPAROSCOPIC APPENDECTOMY N/A 01/10/2017   Procedure: LAPAROSCOPIC APPENDECTOMY;  Surgeon: Gaynelle Adu, MD;  [facility];  Service: General;  Laterality: N/A;    FAMILY HISTORY: Family History  Problem Relation Age of Onset   Carpal tunnel syndrome Mother    Diabetes Father    Cancer Paternal Aunt        breast   Stroke Neg Hx    Heart disease Neg Hx    Muscular dystrophy Neg Hx     SOCIAL HISTORY: Social History   Socioeconomic History   Marital status: Single    Spouse name: Not on file   Number of children: 1   Years of education: Not on file   Highest education level: Some college, no degree  Occupational History    Comment: [employer]  Tobacco Use   Smoking status: Never   Smokeless tobacco: Never  Vaping Use   Vaping Use: Never used  Substance and Sexual Activity   Alcohol use: No   Drug use: Not Currently    Comment: 4-5  use/week   Sexual activity: Not on file  Other Topics Concern   Not on file  Social History Narrative   Lives at home with his girlfriend and daughter   Right handed   Caffeine: 2 cans/day of soda off and on   Social Determinants of Health   Financial Resource Strain: Not on file  Food Insecurity: Not on file  Transportation Needs: Not on file  Physical Activity: Not on file  Stress: Not on file  Social Connections: Not on file  Intimate Partner Violence: Not on file      Levert Feinstein, M.D. Ph.D.  [facility] [address], [address] [place], [place] [address] Ph: [phone] Fax: [phone]  CC:  No referring provider defined for this encounter.  Patient, No Pcp Per (Inactive)

## 2021-06-21 LAB — HEMOGLOBIN A1C
Est. average glucose Bld gHb Est-mCnc: 108 mg/dL
Hgb A1c MFr Bld: 5.4 % (ref 4.8–5.6)

## 2021-06-21 LAB — LIPID PANEL
Chol/HDL Ratio: 4.1 ratio (ref 0.0–5.0)
Cholesterol, Total: 159 mg/dL (ref 100–199)
HDL: 39 mg/dL — ABNORMAL LOW (ref >39)
LDL Chol Calc (NIH): 95 mg/dL (ref 0–99)
Triglycerides: 140 mg/dL (ref 0–149)
VLDL Cholesterol Cal: 25 mg/dL (ref 5–40)

## 2021-08-16 ENCOUNTER — Encounter: Payer: Self-pay | Admitting: Neurology

## 2021-08-16 ENCOUNTER — Ambulatory Visit: Payer: Managed Care, Other (non HMO) | Admitting: Neurology

## 2021-08-16 VITALS — BP 138/84 | HR 82 | Ht 69.0 in | Wt 241.0 lb

## 2021-08-16 DIAGNOSIS — G7000 Myasthenia gravis without (acute) exacerbation: Secondary | ICD-10-CM

## 2021-08-16 DIAGNOSIS — R52 Pain, unspecified: Secondary | ICD-10-CM | POA: Diagnosis not present

## 2021-08-16 MED ORDER — DULOXETINE HCL 60 MG PO CPEP: 60.0000 mg | ORAL_CAPSULE | Freq: Every day | ORAL | 12 refills | Status: DC

## 2021-08-16 NOTE — Progress Notes (Signed)
Chief Complaint  Patient presents with   Myasthenia Gravis    Follow UP: Routine visit, no new or changes New room; alone in room      ASSESSMENT AND PLAN  Dylan [name]. is a 25 y.o. male   Dylan [name]. is a 25 y.o. male   Seropositive generalized myasthenia gravis  Acetylcholine receptor binding antibody was +7.58 on December 29, 2019, acetylcholine blocking antibody was +34  CT chest showed no thymus pathology  He responded very well to prednisone, and CellCept treatment,  Prednisone treatment since January 06, 2020, 60 mg to begin with,    CellCept since March 2021, he is tolerating well, keep 500 mg 3 tablets twice a day,   IVIG: He was given IVIG in July 2021, could not tolerate it developed severe headache during first dose IV infusion, continued throughout the day, recurrent headache-second trial, could not tolerate IVIG  he responded well to prednisone treatment, but whenever prednisone dosage was decreased to below 30 mg daily, he would develop proximal muscle weakness, despite Mestinon 3 to 4 tablets daily  Solaris was started in March 2022,    900 mg weekly for the first 4 weeks, followed by   1200 mg for the fifth dose 1 week later, on May 4th 2022   1200 mg every 2 weeks thereafter.  He responded very well to Solaris treatment,   We began prednisone tapering from 10 to 5mg  since July 2022, when prednisone dosage was decreased to 5 mg daily, he did not notice any significant muscle weakness, but noticed neck pain, diffuse muscle achiness, there is slight neck flexion, bilateral shoulder abduction weakness, will keep current dose of CellCept 500 mg 3 tablets twice a day, prednisone 5 mg daily, continue Solaris treatment, he only use Mestinon 60 mg as needed every 1 to 2 days      DIAGNOSTIC DATA (LABS, IMAGING, TESTING) - I reviewed patient records, labs, notes, testing and imaging myself where available. CT of the chest in February 2021 was  normal, no evidence of thymic pathology  MRI of the brain with without contrast February 2021 was normal  Positive acetylcholine antibody in January 2021, binding antibody 7.58, modulating antibody 38, blocking antibody 34,  HISTORICAL Dylan [name]. is a 25 year old male, seen in request by my colleague Dr. 30 for evaluation of myasthenia gravis, initial evaluation was on January 06, 2020.  I have reviewed and summarized the referring note from the referring physician.  He was seen by Dr. January 08, 2020 on January 2021, complained of 2 years history of muscle weakness, laboratory evaluation showed positive acetylcholine blocking antibody, binding antibody with titer of 7.58, mild elevated CPK 457  He used to play sports regularly, basketball, wrestling, and workout regularly, in August 2018, when he went back to workout after a month off, he noticed generalized weakness, difficulty moving around, difficulty lifting weight, his weakness intermittent, but overall gradually getting worse over the past 2 years, sometimes he is so weak to the point of could not lift a soda can, has difficulty walking, fell to the floor, could not get himself up, he denies significant ptosis, diplopia, denies swallowing difficulty, does feel short winded with minimum exertion,  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, 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, 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[employer], clinical He denies sensory loss, he complains of diffuse muscle achy pain  UPDATE Jan 26 2020: He is taking prednisone 40 mg daily, noticed mild  improvement while taking 60 mg daily, Mestinon 60 mg 3 times daily has helped his symptoms, but he still has intermittent weakness, "crushed after a while", he denies swallowing difficulty, no breathing difficulty, felt chest pressure sometimes  I personally reviewed CT chest, no thymus pathology noted  MRI of the brain and cervical spine showed no significant abnormality  Laboratory  evaluations, positive acetylcholine receptor binding, blocking, and modulating antibodies, elevated SSA 5.9, normal and negative RPR, hepatitis C, rheumatoid factor, heavy metal, vitamin B1, 6, methylmalonic acid level, A1c, lactic acid, , magnesium, TSH, HIV, B12,  Normal IgA level, mild elevated IgG 1793,  UPDATE February 23 2020: He was started on prednisone 60 mg since January 06, 2020, has been on prednisone 40 mg daily since January 24, 2020, tolerating it well, on higher dose of Mestinon 60 mg 4 times a day, complains of bloating, I have prescribed CellCept 1500 mg twice a day, still in the process of preauthorization, IVIG still pending  If he misses Mestinon, he would have significant weakness, he describes 1 morning when he hold his toddler, he fell to the ground, could not get up, now he learned to time his Mestinon to when he needs to be strong, he took first dose 5 AM, able to pick up his daughter sent her to school at 8 AM, then he takes a nap, second dose was around 2 PM, then 5 PM, 9 PM.  UPDATE May 02 2020: He is doing very well, tolerating CellCept 1500 mg twice a day, he missed his IVIG appointment, continue taking prednisone 10 mg 4 tablets every morning, Mestinon has decreased from 4 to 3 tablets each day  He now works at a sitting down job at [employer], complains of mild unsteady gait, occasionally bilateral upper extremity weakness, he denies droopy eyelid, double vision, swallowing difficulty, or chewing difficulty, he denies difficulty breathing  He reported his only had 60% of his past despite CellCept 1500 mg daily, prednisone 40 mg daily since February  UPDATE July 03 2020: He return for IVIG treatment, complains of significant headache, during the initial days of IVIG infusion, which still present today, moderate to severe  His myasthenia gravis symptoms overall is under good control with current medications, CellCept 500 mg 3 tablets twice a day, taper down prednisone to 10  mg now, Mestinon 60 mg 3 to 4 tablets daily, he denies significant limitation in daily function  UPDATE Sept 28 2021: Patient is 25 years old daughter during today's interview, exam was taken 1 hour after Mestinon, there was no significant bulbar, or limb muscle weakness noted  Despite CellCept 500 mg 3 tablets twice a day, prednisone 10 mg daily, he still needs Mestinon 60 mg 4 times a day, if he misses a dose, sometimes he felt difficulty holding his daughter, and also weakness in his tongue,  We attempted IVIG treatment on July 03, 2020, he developed severe headache neck stiffness following the first infusion, similar presentation at the beginning of second infusion, we have to abort the IVIG treatment.  He reported ever since then, he noticed intermittent neck pain, radiating stiffness pain to bilateral shoulder.  UPDATE Jan 09 2021: It took prednisone 10 mg 6 tablets every morning from last visit in September 2021, only in January 2022, he began slow tapering at 10 mg decrement every 2 weeks, He began prednisone 10 mg 3 tablets since January 07, 2021, with decrease of prednisone, he felt fatigue, muscle weakness, locking up, he denies double vision,  no swallowing difficulty,  he continued his CellCept 1500 mg twice a day, continue to require Mestinon 60 mg 3-4 times a day,  Today's examination was taken 2 hours after last dose of Mestinon, he was found to have mild bilateral shoulder abduction, external rotation weakness, slight bilateral hip flexion weakness  We also performed myasthenia gravis activity of daily living (MG-ADL), total score was 12 Talking, intermittent slurred, fatigue with soft food, swallowing rare episode of choking, breathing, shortness of breath at rest sometimes, impairment of ability to brush teeth of comb hair, rest. Needed occasionally, impairment of ability to rise from a chair, moderate, always use arms, double vision, none, droopy eyelid, daily, but not  constant  Clinical classification is IIIA, predominantly affecting neck, axilla muscles, lesser involvement of oropharyngeal, respiratory muscles  UPDATE Apr 12 2021: He started Solaris treatment in March 2022, tolerating it very well, it has really controlled his muscle weakness, he is only taking Mestinon 60 mg twice a day, on prednisone 10 mg daily, CellCept 1500 mg twice a day,  UPDATE June 20 2021: He has overall been doing very well, since Solaris treatment, he only use Mestinon as needed, maximum 1 dose a day, no double vision, no difficulty breathing, no significant limb muscle weakness, continued CellCept 500 mg 3 tablets twice a day, started prednisone tapering from 10 mg daily to 7.5 mg on June 14, 2021, tolerating stepping down of the dosage, no flareup weakness  He was noted to have mild moon face, weight gain, central obesity  UPDATE Sept 8 2022: He started prednisone tapering from 7.5 mg to 5 mg in July 2022, he did not notice any recurrent muscle weakness, but noticed neck achiness, intermittent diffuse muscle achiness, on today's examination without Mestinon, he has mild neck flexion, bilateral shoulder abduction weakness,  REVIEW OF SYSTEMS:  Full 14 system review of systems performed and notable only for as above All other review of systems were negative.  PHYSICAL EXAM:   Vitals:   08/16/21 0859  BP: 138/84  Pulse: 82  Weight: 241 lb (109.3 kg)  Height: 5\' 9"  (1.753 m)   Not recorded     Body mass index is 35.59 kg/m.  PHYSICAL EXAMNIATION:  Gen: NAD, conversant, well nourised, well groomed              NEUROLOGICAL EXAM:  MENTAL STATUS: Speech/cognition:    Awake, alert, oriented to history taking and casual conversation CRANIAL NERVES: CN II: Visual fields are full to confrontation. Pupils are round equal and briskly reactive to light. CN III, IV, VI: extraocular movement are normal. No ptosis. CN V: Facial sensation is intact to light touch CN VII:  Face is symmetric with normal eye closure  CN VIII: Hearing is normal to causal conversation. CN IX, X: Phonation is normal. CN XI: Head turning and shoulder shrug are intact  MOTOR: He has slight neck flexion, bilateral shoulder abduction weakness  REFLEXES: Reflexes are 2+ and symmetric at the biceps, triceps, knees, and ankles. Plantar responses are flexor.  SENSORY: Intact to light touch   COORDINATION: There is no trunk or limb dysmetria noted.  GAIT/STANCE: He can get up from low squatting position without difficulty  ALLERGIES: No Known Allergies  HOME MEDICATIONS: Current Outpatient Medications  Medication Sig Dispense Refill   mycophenolate (CELLCEPT) 500 MG tablet Take 3 tablets (1,500 mg total) by mouth 2 (two) times daily. 540 tablet 4   predniSONE (DELTASONE) 5 MG tablet Take 2 tablets (10 mg total)  by mouth daily with breakfast. 60 tablet 6   pyridostigmine (MESTINON) 60 MG tablet Take 1 tablet (60 mg total) by mouth 4 (four) times daily. 120 tablet 11   No current facility-administered medications for this visit.    PAST MEDICAL HISTORY: Past Medical History:  Diagnosis Date   HyperCKemia    MG (myasthenia gravis) (HCC)     PAST SURGICAL HISTORY: Past Surgical History:  Procedure Laterality Date   LAPAROSCOPIC APPENDECTOMY N/A 01/10/2017   Procedure: LAPAROSCOPIC APPENDECTOMY;  Surgeon: Gaynelle Adu, MD;  Location: [facility] OR;  Service: General;  Laterality: N/A;    FAMILY HISTORY: Family History  Problem Relation Age of Onset   Carpal tunnel syndrome Mother    Diabetes Father    Cancer Paternal Aunt        breast   Stroke Neg Hx    Heart disease Neg Hx    Muscular dystrophy Neg Hx     SOCIAL HISTORY: Social History   Socioeconomic History   Marital status: Single    Spouse name: Not on file   Number of children: 1   Years of education: Not on file   Highest education level: Some college, no degree  Occupational History    Comment: [employer]   Tobacco Use   Smoking status: Never   Smokeless tobacco: Never  Vaping Use   Vaping Use: Never used  Substance and Sexual Activity   Alcohol use: No   Drug use: Not Currently    Comment: 4-5 use/week   Sexual activity: Not on file  Other Topics Concern   Not on file  Social History Narrative   Lives at home with his girlfriend and daughter   Right handed   Drinks no caffeine daily   Social Determinants of Health   Financial Resource Strain: Not on file  Food Insecurity: Not on file  Transportation Needs: Not on file  Physical Activity: Not on file  Stress: Not on file  Social Connections: Not on file  Intimate Partner Violence: Not on file      Levert Feinstein, M.D. Ph.D.  [facility] [address] [place], [place] [address] Ph: [phone] Fax: [phone]  CC:  No referring provider defined for this encounter.  Patient, No Pcp Per (Inactive)

## 2021-08-16 NOTE — Patient Instructions (Signed)
Meds ordered this encounter  Medications   DULoxetine (CYMBALTA) 60 MG capsule    Sig: Take 1 capsule (60 mg total) by mouth daily.    Dispense:  30 capsule    Refill:  12     Keep prednisone 5mg  daily.  Cellcept 500mg  3 tab bid  Mestinon 60mg  as needed

## 2021-08-26 ENCOUNTER — Ambulatory Visit
Admission: EM | Admit: 2021-08-26 | Discharge: 2021-08-26 | Disposition: A | Discharge status: Home / Self Care | Payer: Managed Care, Other (non HMO) | Attending: Urgent Care | Admitting: Urgent Care

## 2021-08-26 ENCOUNTER — Other Ambulatory Visit: Payer: Self-pay

## 2021-08-26 DIAGNOSIS — J340 Abscess, furuncle and carbuncle of nose: Secondary | ICD-10-CM | POA: Diagnosis not present

## 2021-08-26 MED ORDER — AMOXICILLIN-POT CLAVULANATE 875-125 MG PO TABS: 1.0000 | ORAL_TABLET | Freq: Two times a day (BID) | ORAL | 0 refills | Status: DC

## 2021-08-26 NOTE — ED Triage Notes (Signed)
Onset this morning of lesions in right nare. Pt notes the lesions are "oozing and bleeding". No meds taken.

## 2021-08-26 NOTE — ED Provider Notes (Signed)
[facility]   MRN: [id number] DOB: [birth year]  Subjective:   Dylan [name]. is a 25 y.o. male presenting for 1 day history of acute onset right-sided swelling of his nose with drainage and exquisite tenderness, burning and stinging.  Has had a posterior headache.  Has a history of myasthenia gravis.  History of allergic rhinitis and sinus infections.  Denies any confusion, weakness, numbness or tingling, vision changes, dizziness.  No current facility-administered medications for this encounter.  Current Outpatient Medications:    DULoxetine (CYMBALTA) 60 MG capsule, Take 1 capsule (60 mg total) by mouth daily., Disp: 30 capsule, Rfl: 12   mycophenolate (CELLCEPT) 500 MG tablet, Take 3 tablets (1,500 mg total) by mouth 2 (two) times daily., Disp: 540 tablet, Rfl: 4   predniSONE (DELTASONE) 5 MG tablet, Take 2 tablets (10 mg total) by mouth daily with breakfast. (Patient taking differently: Take 5 mg by mouth daily with breakfast.), Disp: 60 tablet, Rfl: 6   pyridostigmine (MESTINON) 60 MG tablet, Take 1 tablet (60 mg total) by mouth 4 (four) times daily., Disp: 120 tablet, Rfl: 11   No Known Allergies  Past Medical History:  Diagnosis Date   HyperCKemia    MG (myasthenia gravis) (HCC)      Past Surgical History:  Procedure Laterality Date   LAPAROSCOPIC APPENDECTOMY N/A 01/10/2017   Procedure: LAPAROSCOPIC APPENDECTOMY;  Surgeon: Gaynelle Adu, MD;  Location: [facility] OR;  Service: General;  Laterality: N/A;    Family History  Problem Relation Age of Onset   Carpal tunnel syndrome Mother    Diabetes Father    Cancer Paternal Aunt        breast   Stroke Neg Hx    Heart disease Neg Hx    Muscular dystrophy Neg Hx     Social History   Tobacco Use   Smoking status: Never   Smokeless tobacco: Never  Vaping Use   Vaping Use: Never used  Substance Use Topics   Alcohol use: No   Drug use: Not Currently    Comment: 4-5 use/week    ROS   Objective:    Vitals: BP 138/90 (BP Location: Left Arm)   Pulse 98   Temp 98.6 F (37 C) (Oral)   Resp 18   SpO2 95%   Physical Exam Constitutional:      General: He is not in acute distress.    Appearance: Normal appearance. He is well-developed and normal weight. He is not ill-appearing, toxic-appearing or diaphoretic.  HENT:     Head: Normocephalic and atraumatic.     Right Ear: Tympanic membrane, ear canal and external ear normal.     Left Ear: Tympanic membrane, ear canal and external ear normal.     Nose: Nose normal.      Mouth/Throat:     Pharynx: Oropharynx is clear.  Eyes:     General: No scleral icterus.       Right eye: No discharge.        Left eye: No discharge.     Extraocular Movements: Extraocular movements intact.     Pupils: Pupils are equal, round, and reactive to light.  Cardiovascular:     Rate and Rhythm: Normal rate.  Pulmonary:     Effort: Pulmonary effort is normal.  Musculoskeletal:     Cervical back: Normal range of motion.  Skin:    General: Skin is warm and dry.  Neurological:     Mental Status: He is alert and oriented to  person, place, and time.     Cranial Nerves: No cranial nerve deficit.     Motor: No weakness.     Coordination: Coordination normal.     Gait: Gait normal.     Deep Tendon Reflexes: Reflexes normal.  Psychiatric:        Mood and Affect: Mood normal.        Behavior: Behavior normal.        Thought Content: Thought content normal.        Judgment: Judgment normal.    Assessment and Plan :   PDMP not reviewed this encounter.  1. Nasal furuncle     Will manage for nasal furuncle with Augmentin.  Recommended conservative management and supportive care otherwise.  No signs of an acute encephalopathy related to the furuncle. Counseled patient on potential for adverse effects with medications prescribed/recommended today, ER and return-to-clinic precautions discussed, patient verbalized understanding.    Wallis Bamberg,  PA-C 08/26/21 1215

## 2022-02-11 ENCOUNTER — Encounter: Payer: Self-pay | Admitting: Neurology

## 2022-02-11 ENCOUNTER — Ambulatory Visit: Payer: BC Managed Care – PPO | Admitting: Neurology

## 2022-02-11 VITALS — BP 120/81 | HR 74 | Ht 69.0 in | Wt 238.5 lb

## 2022-02-11 DIAGNOSIS — G7000 Myasthenia gravis without (acute) exacerbation: Secondary | ICD-10-CM

## 2022-02-11 DIAGNOSIS — Z79899 Other long term (current) drug therapy: Secondary | ICD-10-CM | POA: Insufficient documentation

## 2022-02-11 DIAGNOSIS — E7800 Pure hypercholesterolemia, unspecified: Secondary | ICD-10-CM | POA: Insufficient documentation

## 2022-02-11 MED ORDER — PREDNISONE 5 MG PO TABS
10.0000 mg | ORAL_TABLET | Freq: Every day | ORAL | 3 refills | Status: DC
Start: 2022-02-11 — End: 2022-11-27

## 2022-02-11 NOTE — Progress Notes (Signed)
Chief Complaint  Patient presents with   Follow-up    Rm 14. Alone. PCP is [facility]. Denies any new or worsening symptoms.      ASSESSMENT AND PLAN  Dylan Niannthony Darnell Swayne Jr. is a 26 y.o. male   Seropositive generalized myasthenia gravis  Acetylcholine receptor binding antibody was +7.58 on December 29, 2019, acetylcholine blocking antibody was +34  CT chest showed no thymus pathology  He responded very well to prednisone, and CellCept treatment,  Prednisone treatment since January 06, 2020, 60 mg to begin with,    CellCept since March 2021, he is tolerating well, keep 500 mg 3 tablets twice a day,   IVIG: He was given IVIG in July 2021, could not tolerate it developed severe headache during first dose IV infusion, continued throughout the day, recurrent headache-second trial, could not tolerate IVIG  he responded well to prednisone treatment, but whenever prednisone dosage was decreased to below 30 mg daily, he would develop proximal muscle weakness, despite Mestinon 3 to 4 tablets daily  Solaris was started in March 2022,    900 mg weekly for the first 4 weeks, followed by   1200 mg for the fifth dose 1 week later, on May 4th 2022   1200 mg every 2 weeks thereafter.  He responded very well to Solaris treatment,   We began prednisone tapering from 10 to 5mg  since July 2022, when prednisone dosage was decreased to 5 mg daily, he did not notice any significant muscle weakness, but noticed neck pain, diffuse muscle achiness, there is slight neck flexion, bilateral shoulder abduction weakness, lower dose of prednisone,  Will keep current prednisone dosage of 10 mg daily, CellCept 1500 mg twice a day, continue Solaris infusion high risk management  Laboratory evaluations  DIAGNOSTIC DATA (LABS, IMAGING, TESTING) - I reviewed patient records, labs, notes, testing and imaging myself where available. CT of the chest in February 2021 was normal, no evidence of thymic  pathology  MRI of the brain with without contrast February 2021 was normal  Positive acetylcholine antibody in January 2021, binding antibody 7.58, modulating antibody 38, blocking antibody 34,  HISTORICAL Dylan Niannthony Darnell Luebke Jr. is a 26 year old male, seen in request by my colleague Dr. Lucia GaskinsAhern for evaluation of myasthenia gravis, initial evaluation was on January 06, 2020.  I have reviewed and summarized the referring note from the referring physician.  He was seen by Dr. Lucia GaskinsAhern on January 2021, complained of 2 years history of muscle weakness, laboratory evaluation showed positive acetylcholine blocking antibody, binding antibody with titer of 7.58, mild elevated CPK 457  He used to play sports regularly, basketball, wrestling, and workout regularly, in August 2018, when he went back to workout after a month off, he noticed generalized weakness, difficulty moving around, difficulty lifting weight, his weakness intermittent, but overall gradually getting worse over the past 2 years, sometimes he is so weak to the point of could not lift a soda can, has difficulty walking, fell to the floor, could not get himself up, he denies significant ptosis, diplopia, denies swallowing difficulty, does feel short winded with minimum exertion,  He works as a Psychologist, [employer] pizza delivery person, 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 building He denies sensory loss, he complains of diffuse muscle achy pain  UPDATE Jan 26 2020: He is taking prednisone 40 mg daily, noticed mild improvement while taking 60 mg daily, Mestinon 60 mg 3 times daily has helped his symptoms, but he still  has intermittent weakness, "crushed after a while", he denies swallowing difficulty, no breathing difficulty, felt chest pressure sometimes  I personally reviewed CT chest, no thymus pathology noted  MRI of the brain and cervical spine showed no significant abnormality  Laboratory evaluations, positive acetylcholine  receptor binding, blocking, and modulating antibodies, elevated SSA 5.9, normal and negative RPR, hepatitis C, rheumatoid factor, heavy metal, vitamin B1, 6, methylmalonic acid level, A1c, lactic acid, , magnesium, TSH, HIV, B12,  Normal IgA level, mild elevated IgG 1793,  UPDATE February 23 2020: He was started on prednisone 60 mg since January 06, 2020, has been on prednisone 40 mg daily since January 24, 2020, tolerating it well, on higher dose of Mestinon 60 mg 4 times a day, complains of bloating, I have prescribed CellCept 1500 mg twice a day, still in the process of preauthorization, IVIG still pending  If he misses Mestinon, he would have significant weakness, he describes 1 morning when he hold his toddler, he fell to the ground, could not get up, now he learned to time his Mestinon to when he needs to be strong, he took first dose 5 AM, able to pick up his daughter sent her to school at 8 AM, then he takes a nap, second dose was around 2 PM, then 5 PM, 9 PM.  UPDATE May 02 2020: He is doing very well, tolerating CellCept 1500 mg twice a day, he missed his IVIG appointment, continue taking prednisone 10 mg 4 tablets every morning, Mestinon has decreased from 4 to 3 tablets each day  He now works at a sitting down job at [employer], complains of mild unsteady gait, occasionally bilateral upper extremity weakness, he denies droopy eyelid, double vision, swallowing difficulty, or chewing difficulty, he denies difficulty breathing  He reported his only had 60% of his past despite CellCept 1500 mg daily, prednisone 40 mg daily since February  UPDATE July 03 2020: He return for IVIG treatment, complains of significant headache, during the initial days of IVIG infusion, which still present today, moderate to severe  His myasthenia gravis symptoms overall is under good control with current medications, CellCept 500 mg 3 tablets twice a day, taper down prednisone to 10 mg now, Mestinon 60 mg 3 to 4  tablets daily, he denies significant limitation in daily function  UPDATE Sept 28 2021: Patient is 41 years old daughter during today's interview, exam was taken 1 hour after Mestinon, there was no significant bulbar, or limb muscle weakness noted  Despite CellCept 500 mg 3 tablets twice a day, prednisone 10 mg daily, he still needs Mestinon 60 mg 4 times a day, if he misses a dose, sometimes he felt difficulty holding his daughter, and also weakness in his tongue,  We attempted IVIG treatment on July 03, 2020, he developed severe headache neck stiffness following the first infusion, similar presentation at the beginning of second infusion, we have to abort the IVIG treatment.  He reported ever since then, he noticed intermittent neck pain, radiating stiffness pain to bilateral shoulder.  UPDATE Jan 09 2021: It took prednisone 10 mg 6 tablets every morning from last visit in September 2021, only in January 2022, he began slow tapering at 10 mg decrement every 2 weeks, He began prednisone 10 mg 3 tablets since January 07, 2021, with decrease of prednisone, he felt fatigue, muscle weakness, locking up, he denies double vision, no swallowing difficulty,  he continued his CellCept 1500 mg twice a day, continue to require Mestinon 60 mg  3-4 times a day,  Today's examination was taken 2 hours after last dose of Mestinon, he was found to have mild bilateral shoulder abduction, external rotation weakness, slight bilateral hip flexion weakness  We also performed myasthenia gravis activity of daily living (MG-ADL), total score was 12 Talking, intermittent slurred, fatigue with soft food, swallowing rare episode of choking, breathing, shortness of breath at rest sometimes, impairment of ability to brush teeth of comb hair, rest. Needed occasionally, impairment of ability to rise from a chair, moderate, always use arms, double vision, none, droopy eyelid, daily, but not constant  Clinical classification is  IIIA, predominantly affecting neck, axilla muscles, lesser involvement of oropharyngeal, respiratory muscles  UPDATE Apr 12 2021: He started Solaris treatment in March 2022, tolerating it very well, it has really controlled his muscle weakness, he is only taking Mestinon 60 mg twice a day, on prednisone 10 mg daily, CellCept 1500 mg twice a day,  UPDATE June 20 2021: He has overall been doing very well, since Solaris treatment, he only use Mestinon as needed, maximum 1 dose a day, no double vision, no difficulty breathing, no significant limb muscle weakness, continued CellCept 500 mg 3 tablets twice a day, started prednisone tapering from 10 mg daily to 7.5 mg on June 14, 2021, tolerating stepping down of the dosage, no flareup weakness  He was noted to have mild moon face, weight gain, central obesity  UPDATE Sept 8 2022: He started prednisone tapering from 7.5 mg to 5 mg in July 2022, he did not notice any recurrent muscle weakness, but noticed neck achiness, intermittent diffuse muscle achiness, on today's examination without Mestinon, he has mild neck flexion, bilateral shoulder abduction weakness,  Update February 11, 2022 He noticed mild muscle weakness, achy pain when prednisone dose it was tapered off of to 5 mg daily, feeling better 10 mg daily, continue with CellCept 1500 twice a day, Solaris, functioning well, rarely take Mestinon  But on today's examination, he continue has mild bilateral upper extremity proximal muscle weakness  REVIEW OF SYSTEMS:  Full 14 system review of systems performed and notable only for as above All other review of systems were negative.  PHYSICAL EXAM:   Vitals:   02/11/22 0904  BP: 120/81  Pulse: 74  Weight: 238 lb 8 oz (108.2 kg)  Height: 5\' 9"  (1.753 m)   Not recorded     Body mass index is 35.22 kg/m.  PHYSICAL EXAMNIATION:  Gen: NAD, conversant, well nourised, well groomed              NEUROLOGICAL EXAM:  MENTAL  STATUS: Speech/cognition:    Awake, alert, oriented to history taking and casual conversation CRANIAL NERVES: CN II: Visual fields are full to confrontation. Pupils are round equal and briskly reactive to light. CN III, IV, VI: extraocular movement are normal. No ptosis. CN V: Facial sensation is intact to light touch CN VII: Face is symmetric with normal eye closure  CN VIII: Hearing is normal to causal conversation. CN IX, X: Phonation is normal. CN XI: Head turning and shoulder shrug are intact  MOTOR: He has no neck flexion weakness, mild bilateral shoulder abduction external rotation weakness,  REFLEXES: Reflexes are 2+ and symmetric at the biceps, triceps, knees, and ankles. Plantar responses are flexor.  SENSORY: Intact to light touch   COORDINATION: There is no trunk or limb dysmetria noted.  GAIT/STANCE: He can get up from low squatting position without difficulty  ALLERGIES: No Known Allergies  HOME  MEDICATIONS: Current Outpatient Medications  Medication Sig Dispense Refill   mycophenolate (CELLCEPT) 500 MG tablet Take 3 tablets (1,500 mg total) by mouth 2 (two) times daily. 540 tablet 4   predniSONE (DELTASONE) 5 MG tablet Take 2 tablets (10 mg total) by mouth daily with breakfast. (Patient taking differently: Take 5 mg by mouth daily with breakfast.) 60 tablet 6   No current facility-administered medications for this visit.    PAST MEDICAL HISTORY: Past Medical History:  Diagnosis Date   HyperCKemia    MG (myasthenia gravis) (HCC)     PAST SURGICAL HISTORY: Past Surgical History:  Procedure Laterality Date   LAPAROSCOPIC APPENDECTOMY N/A 01/10/2017   Procedure: LAPAROSCOPIC APPENDECTOMY;  Surgeon: Gaynelle Adu, MD;  Location: [facility] OR;  Service: General;  Laterality: N/A;    FAMILY HISTORY: Family History  Problem Relation Age of Onset   Carpal tunnel syndrome Mother    Diabetes Father    Cancer Paternal Aunt        breast   Stroke Neg Hx    Heart  disease Neg Hx    Muscular dystrophy Neg Hx     SOCIAL HISTORY: Social History   Socioeconomic History   Marital status: Single    Spouse name: Not on file   Number of children: 1   Years of education: Not on file   Highest education level: Some college, no degree  Occupational History    Comment: [employer]  Tobacco Use   Smoking status: Never   Smokeless tobacco: Never  Vaping Use   Vaping Use: Never used  Substance and Sexual Activity   Alcohol use: No   Drug use: Not Currently    Comment: 4-5 use/week   Sexual activity: Not on file  Other Topics Concern   Not on file  Social History Narrative   Lives at home with his girlfriend and daughter   Right handed   Drinks no caffeine daily   Social Determinants of Health   Financial Resource Strain: Not on file  Food Insecurity: Not on file  Transportation Needs: Not on file  Physical Activity: Not on file  Stress: Not on file  Social Connections: Not on file  Intimate Partner Violence: Not on file      Levert Feinstein, M.D. Ph.D.  [facility] [address] [place], [place] [address] Ph: [phone] Fax: [phone]  CC:  No referring provider defined for this encounter.  Patient, No Pcp Per (Inactive)

## 2022-02-12 LAB — COMPREHENSIVE METABOLIC PANEL
ALT: 36 IU/L (ref 0–44)
AST: 19 IU/L (ref 0–40)
Albumin/Globulin Ratio: 1.8 (ref 1.2–2.2)
Albumin: 4.8 g/dL (ref 4.1–5.2)
Alkaline Phosphatase: 79 IU/L (ref 44–121)
BUN/Creatinine Ratio: 14 (ref 9–20)
BUN: 12 mg/dL (ref 6–20)
Bilirubin Total: 0.2 mg/dL (ref 0.0–1.2)
CO2: 20 mmol/L (ref 20–29)
Calcium: 9.3 mg/dL (ref 8.7–10.2)
Chloride: 104 mmol/L (ref 96–106)
Creatinine, Ser: 0.85 mg/dL (ref 0.76–1.27)
Globulin, Total: 2.6 g/dL (ref 1.5–4.5)
Glucose: 89 mg/dL (ref 70–99)
Potassium: 4.2 mmol/L (ref 3.5–5.2)
Sodium: 140 mmol/L (ref 134–144)
Total Protein: 7.4 g/dL (ref 6.0–8.5)
eGFR: 124 mL/min/{1.73_m2} (ref >59)

## 2022-02-12 LAB — CBC WITH DIFFERENTIAL/PLATELET
Basophils Absolute: 0 10*3/uL (ref 0.0–0.2)
Basos: 0 %
EOS (ABSOLUTE): 0 10*3/uL (ref 0.0–0.4)
Eos: 1 %
Hematocrit: 46.3 % (ref 37.5–51.0)
Hemoglobin: 15.2 g/dL (ref 13.0–17.7)
Immature Grans (Abs): 0 10*3/uL (ref 0.0–0.1)
Immature Granulocytes: 1 %
Lymphocytes Absolute: 1.4 10*3/uL (ref 0.7–3.1)
Lymphs: 28 %
MCH: 30.8 pg (ref 26.6–33.0)
MCHC: 32.8 g/dL (ref 31.5–35.7)
MCV: 94 fL (ref 79–97)
Monocytes Absolute: 0.7 10*3/uL (ref 0.1–0.9)
Monocytes: 14 %
Neutrophils Absolute: 2.9 10*3/uL (ref 1.4–7.0)
Neutrophils: 56 %
Platelets: 226 10*3/uL (ref 150–450)
RBC: 4.94 x10E6/uL (ref 4.14–5.80)
RDW: 12.5 % (ref 11.6–15.4)
WBC: 5.2 10*3/uL (ref 3.4–10.8)

## 2022-02-12 LAB — CK: Total CK: 253 U/L (ref 49–439)

## 2022-02-12 LAB — VITAMIN D 25 HYDROXY (VIT D DEFICIENCY, FRACTURES): Vit D, 25-Hydroxy: 12 ng/mL — ABNORMAL LOW (ref 30.0–100.0)

## 2022-02-12 LAB — TSH: TSH: 1.5 u[IU]/mL (ref 0.450–4.500)

## 2022-02-12 LAB — HGB A1C W/O EAG: Hgb A1c MFr Bld: 5.4 % (ref 4.8–5.6)

## 2022-02-16 IMAGING — CT CT CHEST W/O CM
1 series · 16 of 33 positions shown, 20 images · non-contrast
Comparison: None.

CLINICAL DATA: Progression of myasthenia gravis. Chest pressure.

EXAM:
CT CHEST WITHOUT CONTRAST
TECHNIQUE: Multidetector CT imaging of the chest was performed following the
standard protocol without IV contrast.

[Series 2: chest w/(date) · axial · 0.82mm/px · z∈[-281,-39]mm · 16 of 131 slices shown, 20 images]
[im 5/131  mediastinal]
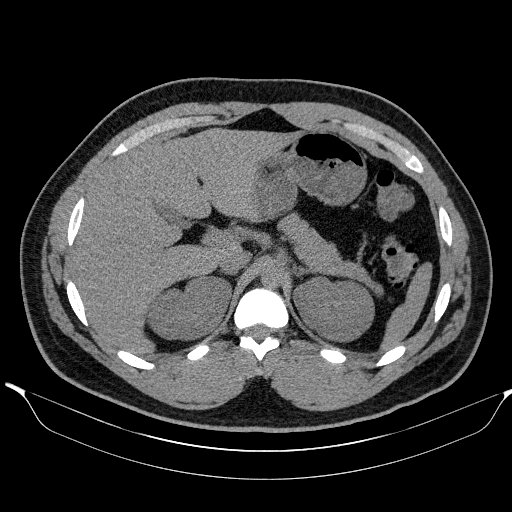
[im 5/131  lung]
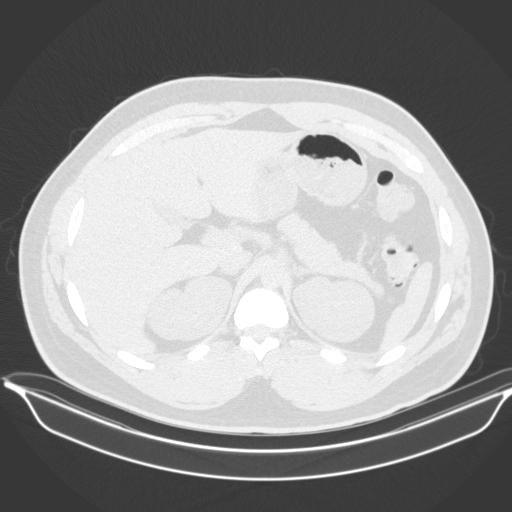
[im 15/131  lung]
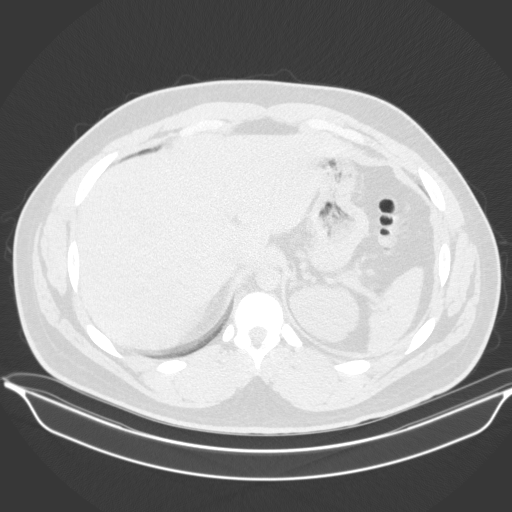
[im 25/131  lung]
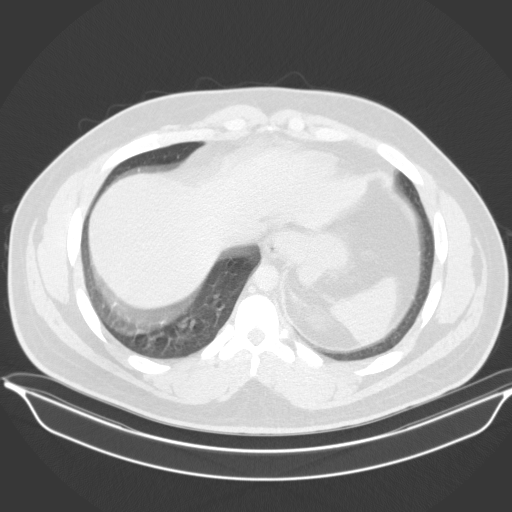
[im 29/131  lung]
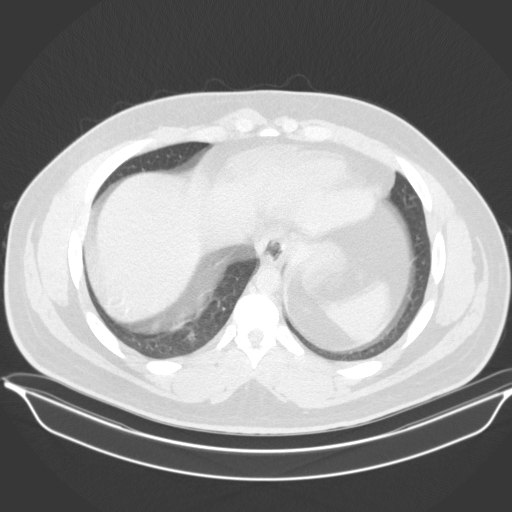
[im 39/131  mediastinal]
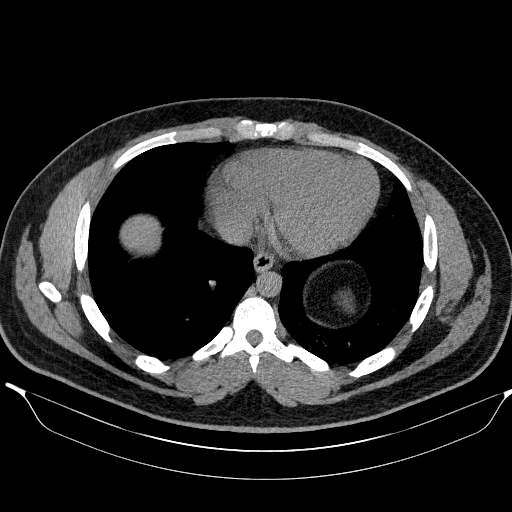
[im 39/131  lung]
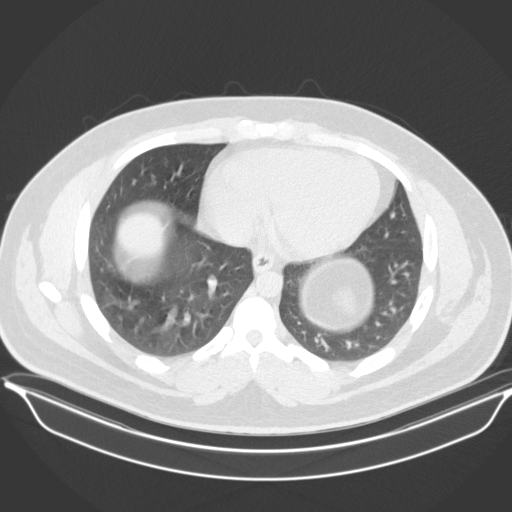
[im 49/131  lung]
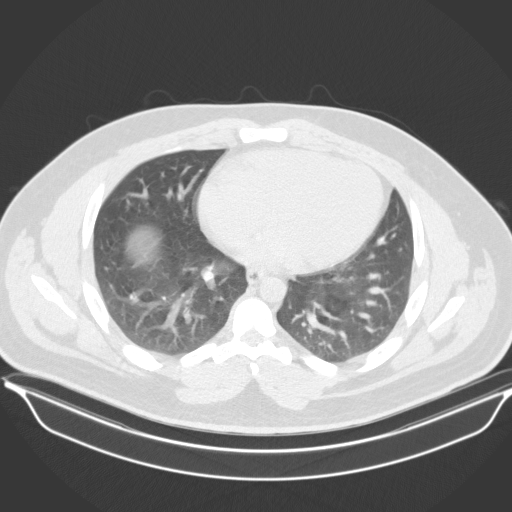
[im 58/131  lung]
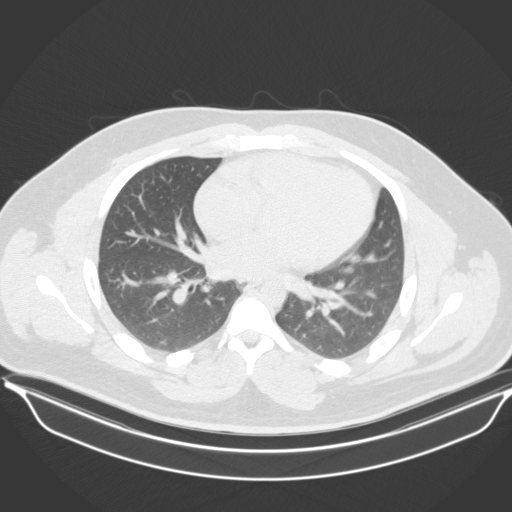
[im 63/131  lung]
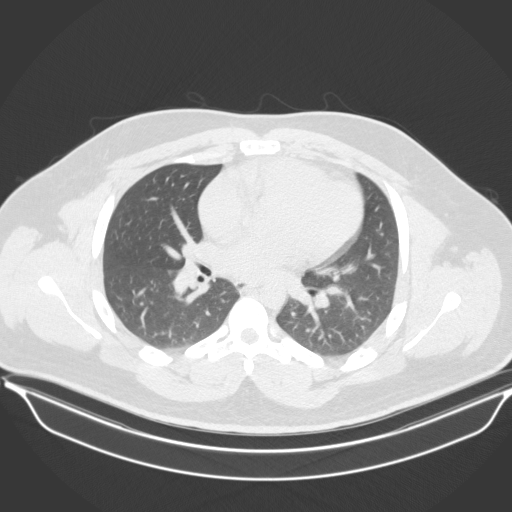
[im 70/131  mediastinal]
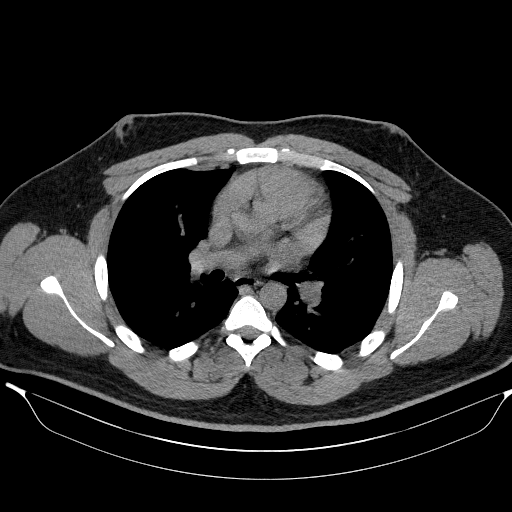
[im 70/131  lung]
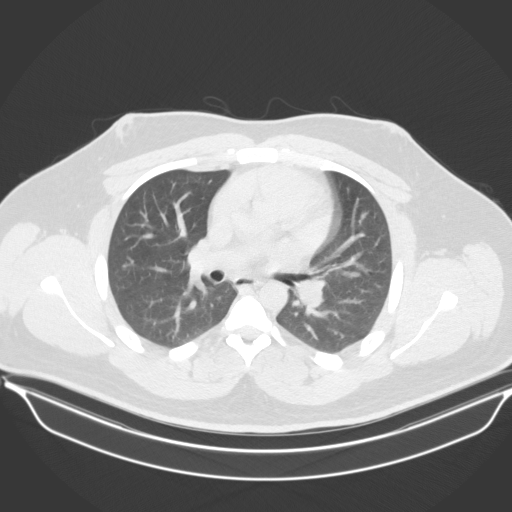
[im 78/131  lung]
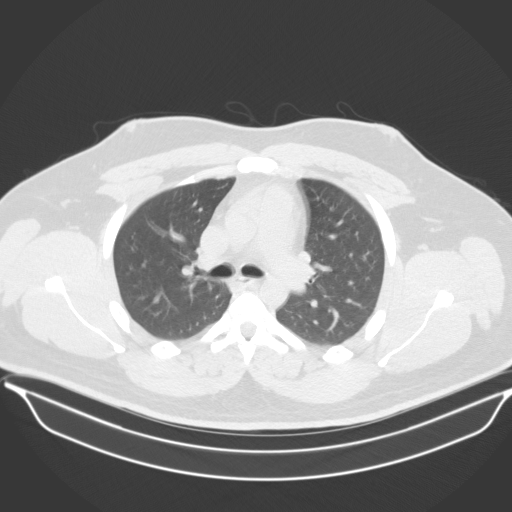
[im 82/131  lung]
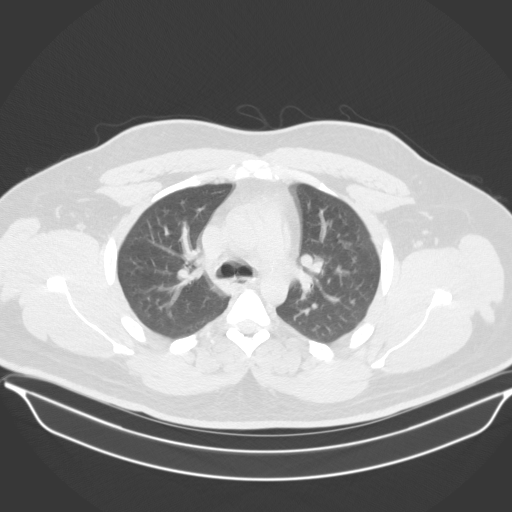
[im 92/131  lung]
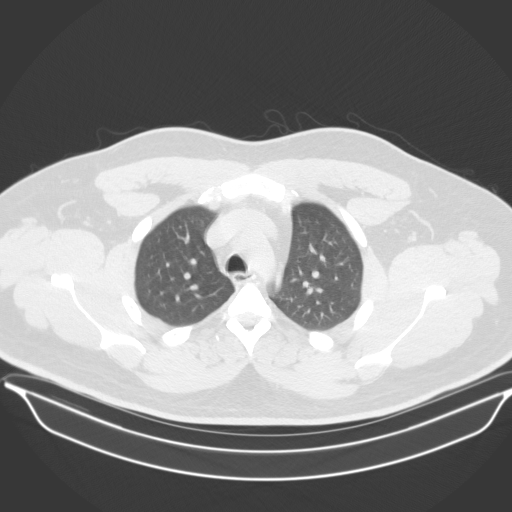
[im 102/131  mediastinal]
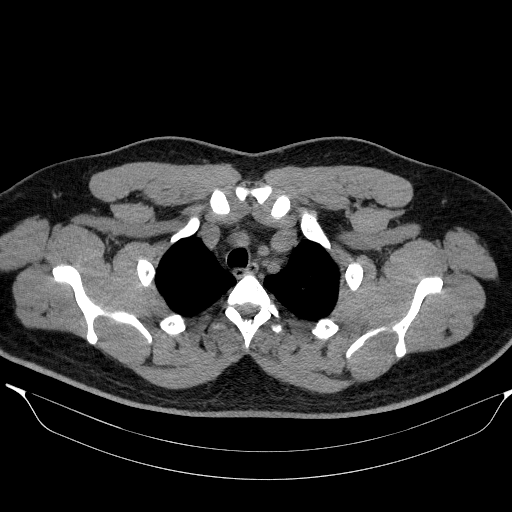
[im 102/131  lung]
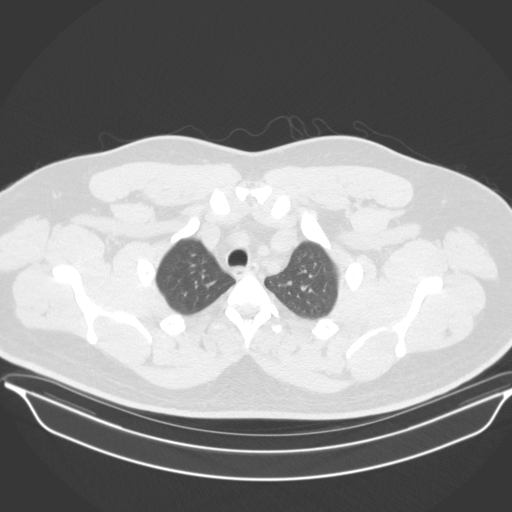
[im 106/131  lung]
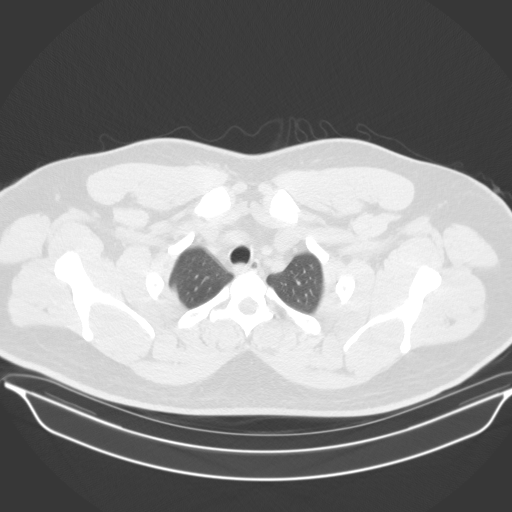
[im 116/131  lung]
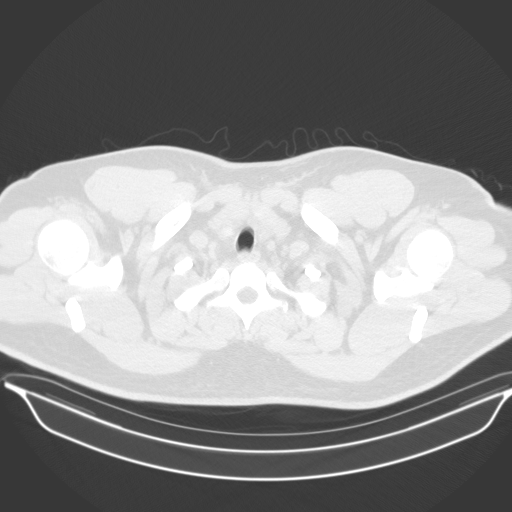
[im 126/131  lung]
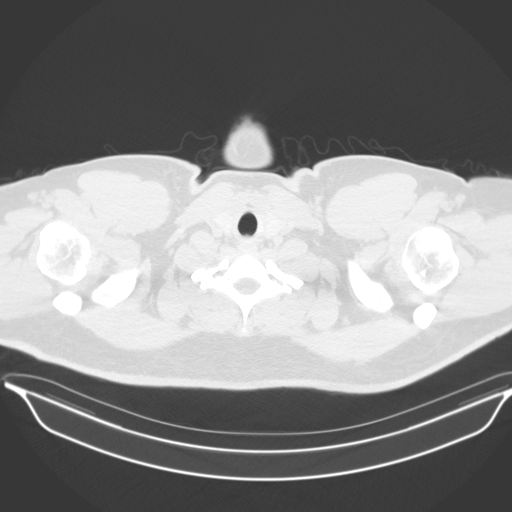

[16 of 33 positions shown; findings below may reference images not displayed]

FINDINGS: Cardiovascular: The heart size is normal. No pericardial effusion
identified.

Mediastinum/Nodes: Normal appearance of the thyroid gland. The
trachea appears patent and is midline. Normal appearance of the
esophagus. No mediastinal mass or adenopathy identified.

Lungs/Pleura: Lungs are clear. No pleural effusion, airspace
consolidation, or atelectasis. No pneumothorax.

Upper Abdomen: No acute abnormality.

Musculoskeletal: No chest wall mass or suspicious bone lesions
identified.
IMPRESSION: Normal exam.

## 2022-03-01 IMAGING — DX DG CHEST 2V
2 series · 2 of 2 positions shown · non-contrast
Comparison: CT scan of the chest dated 01/25/2020

CLINICAL DATA: Chest pain, shortness of breath, cough and chest
congestion. Nausea and vomiting. Upper respiratory symptoms.
Immunocompromised.

EXAM:
CHEST - 2 VIEW

[chest pa]
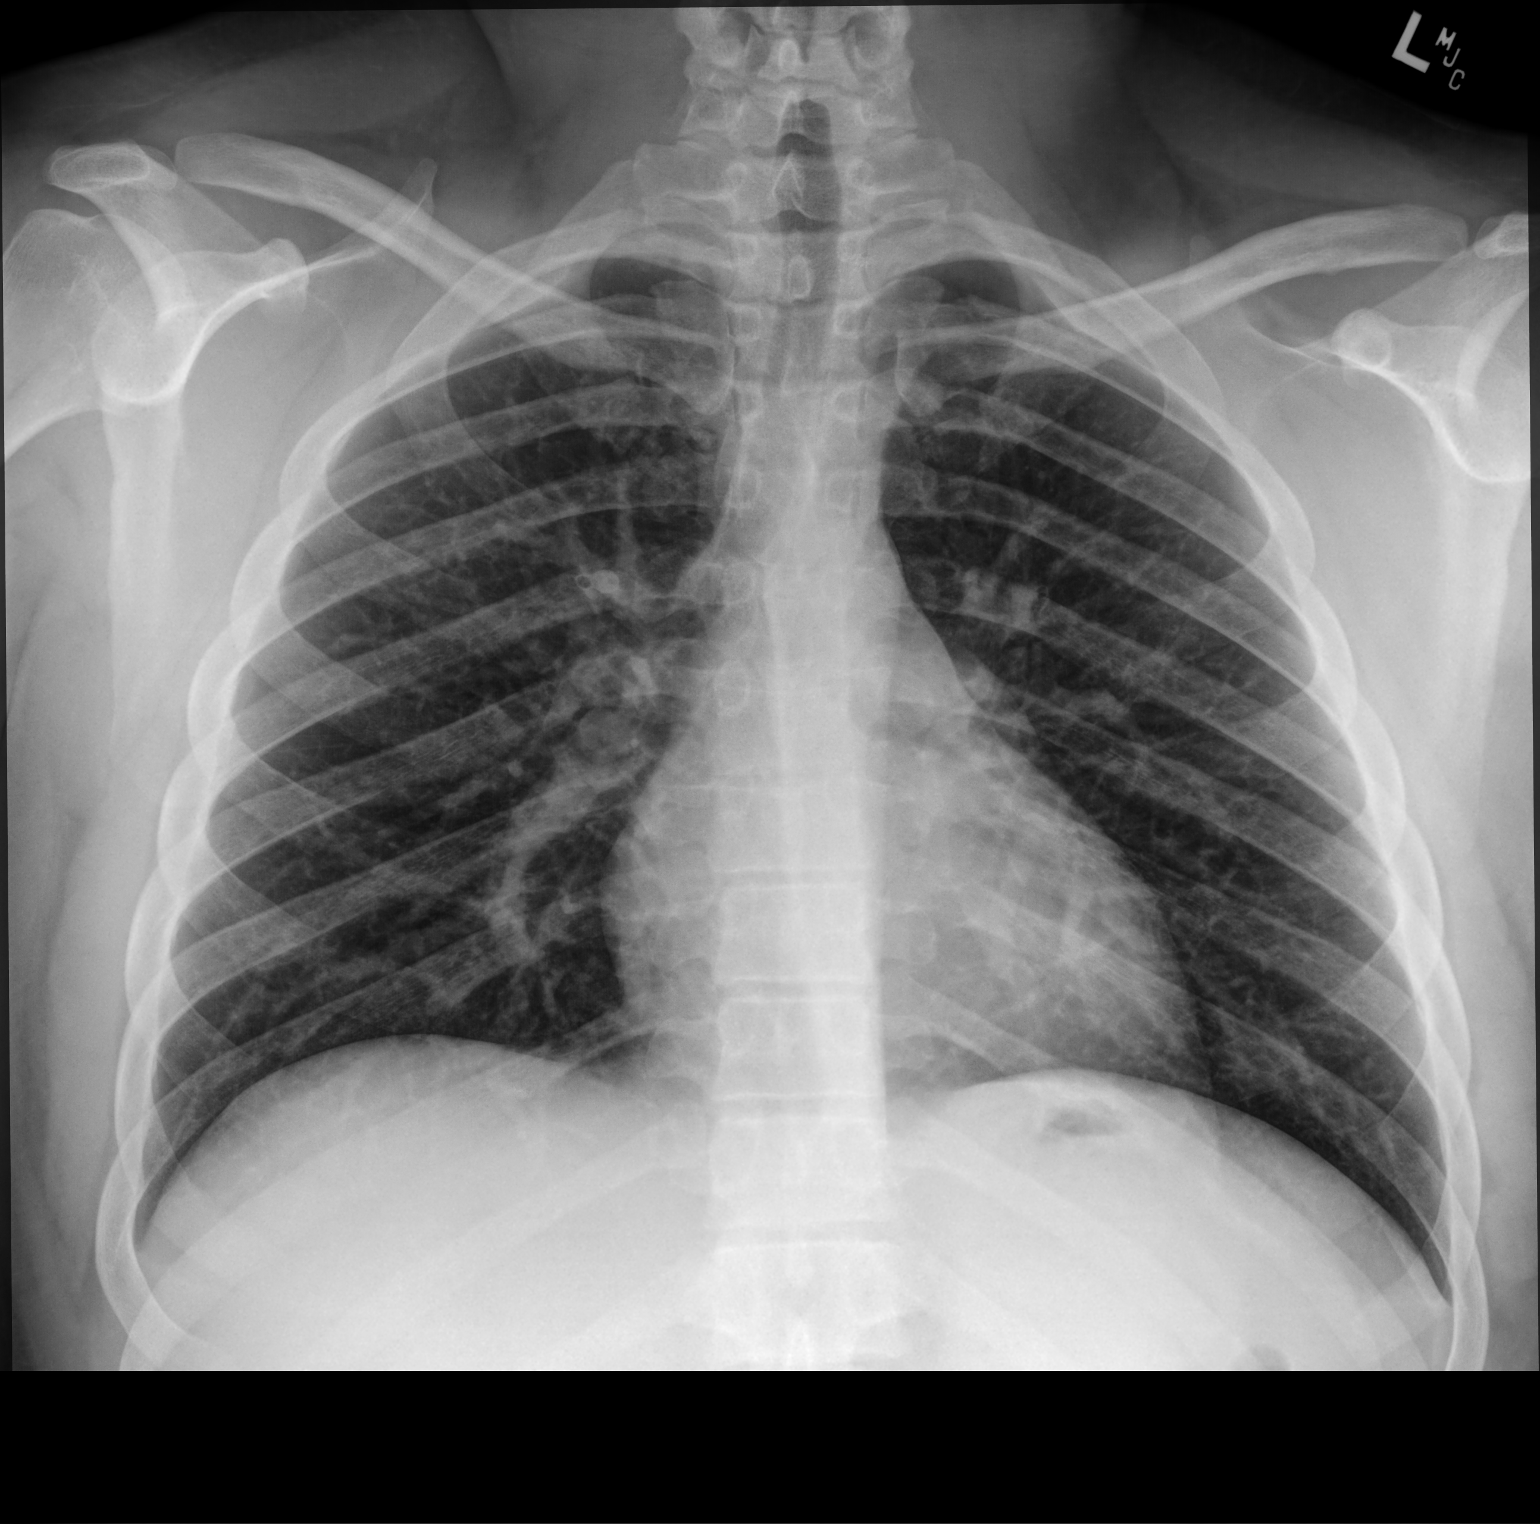

[chest lat]
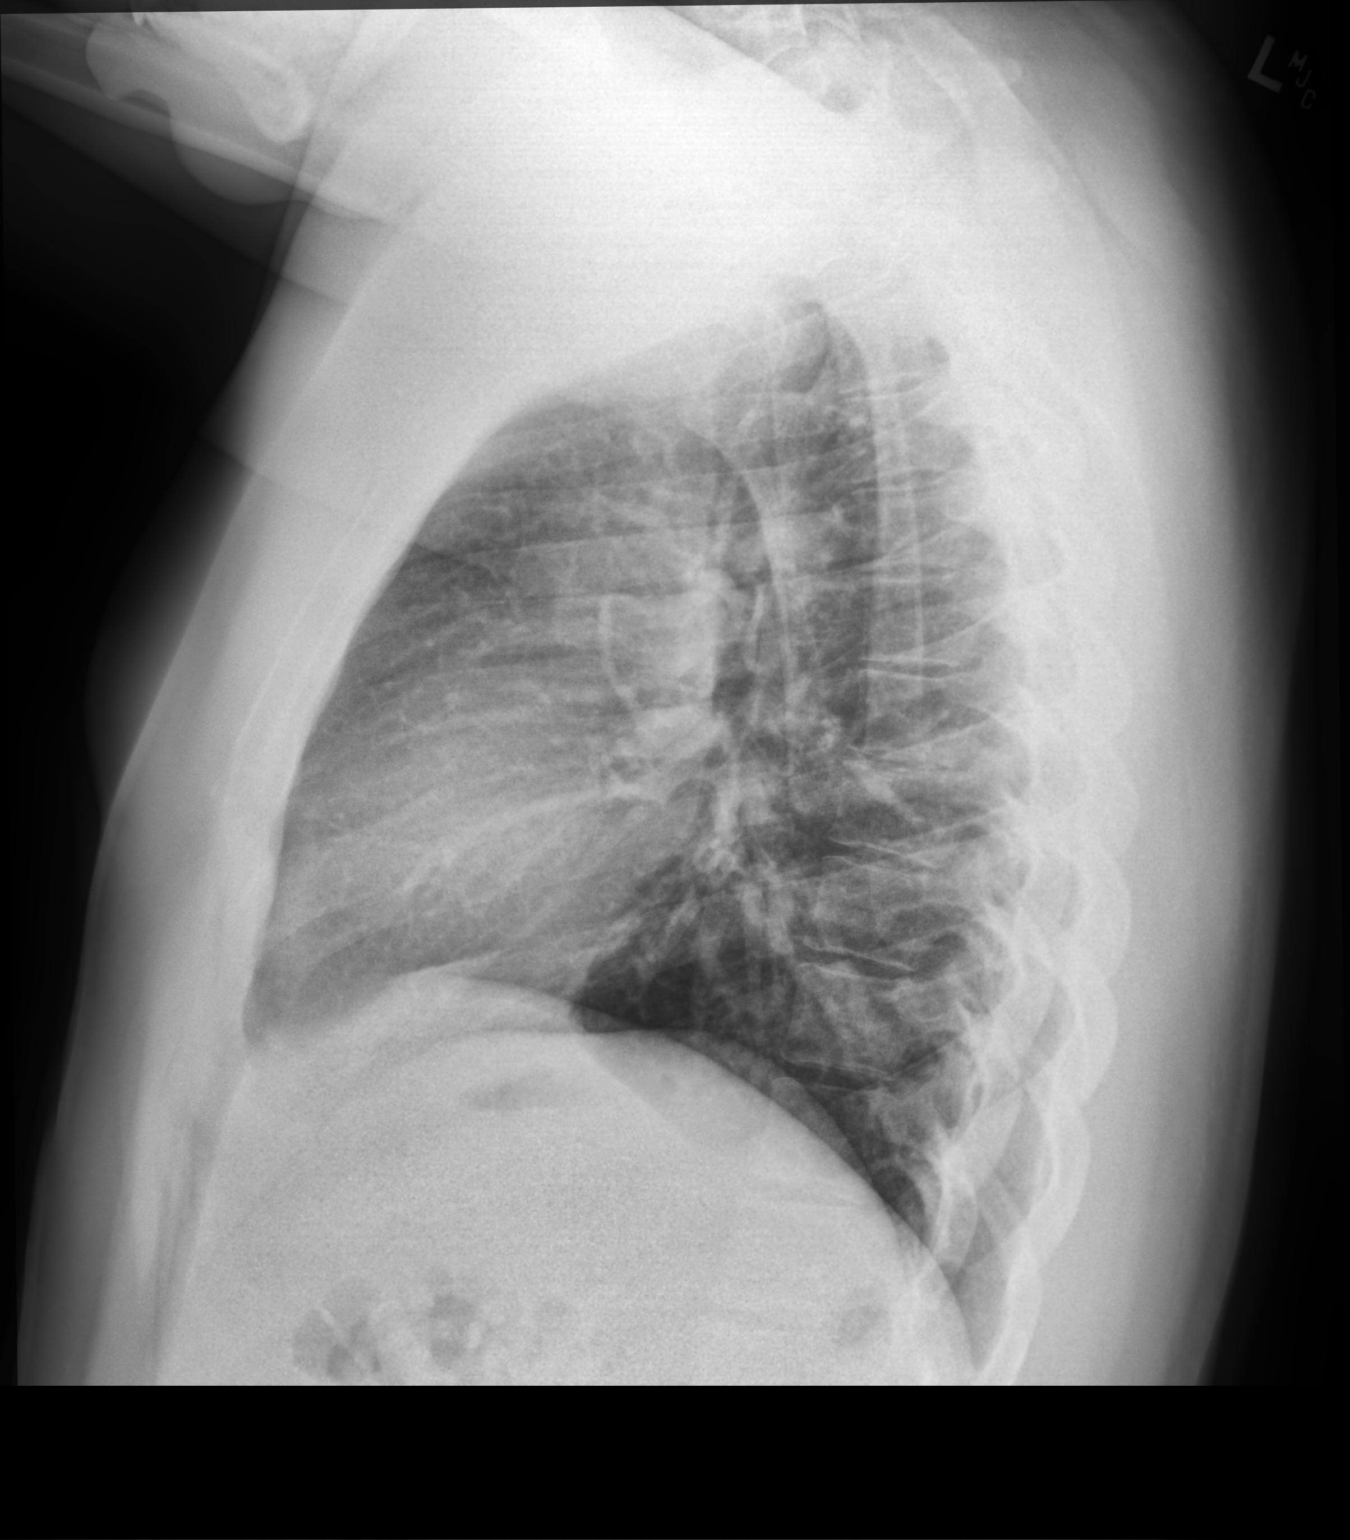

[2 of 2 positions shown; findings below may reference images not displayed]

FINDINGS: The heart size and mediastinal contours are within normal limits.
Both lungs are clear except for minimal peribronchial thickening.
The visualized skeletal structures are unremarkable.
IMPRESSION: Minimal bronchitic changes.

## 2022-05-03 ENCOUNTER — Other Ambulatory Visit: Payer: Self-pay

## 2022-05-09 ENCOUNTER — Encounter: Payer: Self-pay | Admitting: Neurology

## 2022-05-09 NOTE — Telephone Encounter (Signed)
I spoke to Dr. Terrace Arabia. She does not feel this is related to his MG. She did caution him to be careful with his bottom of feet (cuts, infection, etc) due to being on daily steroids.  I called the patient and provided him with this information. States urgent care indicated it may be plantar fascitis. If this is the case, orthopaedics would be best. He will give his PCP and Ortho a call.

## 2022-06-27 ENCOUNTER — Other Ambulatory Visit: Payer: Self-pay | Admitting: Neurology

## 2022-06-27 DIAGNOSIS — G7000 Myasthenia gravis without (acute) exacerbation: Secondary | ICD-10-CM

## 2022-06-27 NOTE — Telephone Encounter (Signed)
Rx refilled.

## 2022-06-28 ENCOUNTER — Telehealth: Payer: Self-pay | Admitting: Neurology

## 2022-06-28 ENCOUNTER — Other Ambulatory Visit: Payer: Self-pay | Admitting: Neurology

## 2022-06-28 NOTE — Telephone Encounter (Signed)
Pt is needing a refill request for his predniSONE (DELTASONE) 5 MG tablet sent in to the [facility] on [address].

## 2022-06-28 NOTE — Telephone Encounter (Signed)
I left a VM on the patient's phone (as per DPR) to inform him Dr. Terrace Arabia placed refills of prednisone 5 MG on file for him.

## 2022-07-31 ENCOUNTER — Other Ambulatory Visit: Payer: Self-pay

## 2022-07-31 ENCOUNTER — Emergency Department (HOSPITAL_COMMUNITY)
Admission: EM | Admit: 2022-07-31 | Discharge: 2022-07-31 | Disposition: A | Discharge status: Home / Self Care | Payer: BC Managed Care – PPO | Attending: Emergency Medicine | Admitting: Emergency Medicine

## 2022-07-31 ENCOUNTER — Encounter (HOSPITAL_COMMUNITY): Payer: Self-pay

## 2022-07-31 ENCOUNTER — Emergency Department (HOSPITAL_COMMUNITY): Payer: BC Managed Care – PPO

## 2022-07-31 DIAGNOSIS — H1133 Conjunctival hemorrhage, bilateral: Secondary | ICD-10-CM | POA: Diagnosis not present

## 2022-07-31 DIAGNOSIS — Z20822 Contact with and (suspected) exposure to covid-19: Secondary | ICD-10-CM | POA: Diagnosis not present

## 2022-07-31 DIAGNOSIS — R112 Nausea with vomiting, unspecified: Secondary | ICD-10-CM | POA: Insufficient documentation

## 2022-07-31 DIAGNOSIS — J069 Acute upper respiratory infection, unspecified: Secondary | ICD-10-CM | POA: Diagnosis not present

## 2022-07-31 LAB — COMPREHENSIVE METABOLIC PANEL
ALT: 35 U/L (ref 0–44)
AST: 22 U/L (ref 15–41)
Albumin: 4.9 g/dL (ref 3.5–5.0)
Alkaline Phosphatase: 52 U/L (ref 38–126)
Anion gap: 9 (ref 5–15)
BUN: 14 mg/dL (ref 6–20)
CO2: 25 mmol/L (ref 22–32)
Calcium: 9.4 mg/dL (ref 8.9–10.3)
Chloride: 106 mmol/L (ref 98–111)
Creatinine, Ser: 0.92 mg/dL (ref 0.61–1.24)
GFR, Estimated: >60 mL/min (ref >60)
Glucose, Bld: 105 mg/dL — ABNORMAL HIGH (ref 70–99)
Potassium: 4.1 mmol/L (ref 3.5–5.1)
Sodium: 140 mmol/L (ref 135–145)
Total Bilirubin: 0.6 mg/dL (ref 0.3–1.2)
Total Protein: 8.6 g/dL — ABNORMAL HIGH (ref 6.5–8.1)

## 2022-07-31 LAB — URINALYSIS, ROUTINE W REFLEX MICROSCOPIC
Bacteria, UA: NONE SEEN
Bilirubin Urine: NEGATIVE
Glucose, UA: NEGATIVE mg/dL
Hgb urine dipstick: NEGATIVE
Ketones, ur: NEGATIVE mg/dL
Leukocytes,Ua: NEGATIVE
Nitrite: NEGATIVE
Protein, ur: 30 mg/dL — AB
Specific Gravity, Urine: 1.03 (ref 1.005–1.030)
pH: 5 (ref 5.0–8.0)

## 2022-07-31 LAB — CBC
HCT: 49.1 % (ref 39.0–52.0)
Hemoglobin: 15.7 g/dL (ref 13.0–17.0)
MCH: 31.1 pg (ref 26.0–34.0)
MCHC: 32 g/dL (ref 30.0–36.0)
MCV: 97.2 fL (ref 80.0–100.0)
Platelets: 195 10*3/uL (ref 150–400)
RBC: 5.05 MIL/uL (ref 4.22–5.81)
RDW: 12.6 % (ref 11.5–15.5)
WBC: 7.6 10*3/uL (ref 4.0–10.5)
nRBC: 0 % (ref 0.0–0.2)

## 2022-07-31 LAB — SARS CORONAVIRUS 2 BY RT PCR: SARS Coronavirus 2 by RT PCR: NEGATIVE

## 2022-07-31 LAB — LIPASE, BLOOD: Lipase: 25 U/L (ref 11–51)

## 2022-07-31 MED ORDER — ONDANSETRON 4 MG PO TBDP: 4.0000 mg | ORAL_TABLET | Freq: Three times a day (TID) | ORAL | 0 refills | Status: DC | PRN

## 2022-07-31 MED ORDER — KETOROLAC TROMETHAMINE 30 MG/ML IJ SOLN
30.0000 mg | Freq: Once | INTRAMUSCULAR | Status: AC
  Administered 2022-07-31: 30 mg via INTRAVENOUS
  Filled 2022-07-31: qty 1

## 2022-07-31 MED ORDER — SODIUM CHLORIDE 0.9 % IV BOLUS
1000.0000 mL | Freq: Once | INTRAVENOUS | Status: AC
  Administered 2022-07-31: 1000 mL via INTRAVENOUS

## 2022-07-31 MED ORDER — ONDANSETRON HCL 4 MG/2ML IJ SOLN
4.0000 mg | Freq: Once | INTRAMUSCULAR | Status: AC
  Administered 2022-07-31: 4 mg via INTRAVENOUS
  Filled 2022-07-31: qty 2

## 2022-07-31 NOTE — ED Provider Notes (Signed)
[facility]-EMERGENCY DEPT Provider Note   CSN: [id number] Arrival date & time: 07/31/22  0857     History  Chief Complaint  Patient presents with   Emesis   Eye Problem    Dylan Cozort. is a 26 y.o. male.  Pt is a 26 yo male with a pmhx significant for myasthenia gravis.  He's been taking cellcept and prednisone.  He's had chills for 2 days.  He's had vomiting this am.  After vomiting, he looked in the mirror and noted some redness to both eyes.  Pt denies any weakness.  He's had pain to his left ring finger.  No trauma.  No pain elsewhere.         Home Medications Prior to Admission medications   Medication Sig Start Date End Date Taking? Authorizing Provider  ondansetron (ZOFRAN-ODT) 4 MG disintegrating tablet Take 1 tablet (4 mg total) by mouth every 8 (eight) hours as needed for nausea or vomiting. 07/31/22  Yes Jacalyn Lefevre, MD  mycophenolate (CELLCEPT) 500 MG tablet TAKE THREE TABLETS BY MOUTH TWICE A DAY 06/27/22   Levert Feinstein, MD  predniSONE (DELTASONE) 5 MG tablet Take 2 tablets (10 mg total) by mouth daily with breakfast. 02/11/22   Levert Feinstein, MD      Allergies    Patient has no known allergies.    Review of Systems   Review of Systems  HENT:         Eye redness  Musculoskeletal:        Left hand pain  All other systems reviewed and are negative.   Physical Exam Updated Vital Signs BP 135/86 (BP Location: Right Arm)   Pulse 80   Temp 98.3 F (36.8 C) (Oral)   Resp 17   SpO2 100%  Physical Exam Vitals and nursing note reviewed.  Constitutional:      Appearance: Normal appearance.  HENT:     Head: Normocephalic and atraumatic.     Right Ear: External ear normal.     Left Ear: External ear normal.     Nose: Nose normal.     Mouth/Throat:     Mouth: Mucous membranes are moist.     Pharynx: Oropharynx is clear.  Eyes:     Extraocular Movements: Extraocular movements intact.     Pupils: Pupils are equal, round, and  reactive to light.     Comments: Bilateral subconjunctival hemorrhage  Cardiovascular:     Rate and Rhythm: Normal rate and regular rhythm.     Pulses: Normal pulses.     Heart sounds: Normal heart sounds.  Pulmonary:     Effort: Pulmonary effort is normal.     Breath sounds: Normal breath sounds.  Abdominal:     General: Abdomen is flat. Bowel sounds are normal.     Palpations: Abdomen is soft.  Musculoskeletal:       Hands:     Cervical back: Normal range of motion and neck supple.  Skin:    General: Skin is warm.     Capillary Refill: Capillary refill takes less than 2 seconds.  Neurological:     General: No focal deficit present.     Mental Status: He is alert and oriented to person, place, and time.  Psychiatric:        Mood and Affect: Mood normal.        Behavior: Behavior normal.     ED Results / Procedures / Treatments   Labs (all labs ordered are  listed, but only abnormal results are displayed) Labs Reviewed  COMPREHENSIVE METABOLIC PANEL - Abnormal; Notable for the following components:      Result Value   Glucose, Bld 105 (*)    Total Protein 8.6 (*)    All other components within normal limits  URINALYSIS, ROUTINE W REFLEX MICROSCOPIC - Abnormal; Notable for the following components:   Protein, ur 30 (*)    All other components within normal limits  SARS CORONAVIRUS 2 BY RT PCR  LIPASE, BLOOD  CBC    EKG None  Radiology DG Hand Complete Left  Result Date: 07/31/2022 CLINICAL DATA:  Left hand pain no injury EXAM: LEFT HAND - COMPLETE 3+ VIEW COMPARISON:  None Available. FINDINGS: There is no evidence of fracture or dislocation. There is no evidence of arthropathy or other focal bone abnormality. Soft tissues are unremarkable. IMPRESSION: Negative. Electronically Signed   By: Marlan Palau M.D.   On: 07/31/2022 11:49    Procedures Procedures    Medications Ordered in ED Medications  sodium chloride 0.9 % bolus 1,000 mL (1,000 mLs Intravenous New  Bag/Given 07/31/22 1120)  ondansetron (ZOFRAN) injection 4 mg (4 mg Intravenous Given 07/31/22 1122)  ketorolac (TORADOL) 30 MG/ML injection 30 mg (30 mg Intravenous Given 07/31/22 1121)    ED Course/ Medical Decision Making/ A&P                           Medical Decision Making Amount and/or Complexity of Data Reviewed Labs: ordered. Radiology: ordered.  Risk Prescription drug management.   This patient presents to the ED for concern of eye redness/n/v, this involves an extensive number of treatment options, and is a complaint that carries with it a high risk of complications and morbidity.  The differential diagnosis includes infection, electrolyte abn   Co morbidities that complicate the patient evaluation  Myasthenia gravis   Additional history obtained:  Additional history obtained from epic chart review External records from outside source obtained and reviewed including sig other   Lab Tests:  I Ordered, and personally interpreted labs.  The pertinent results include:  cbc nl, cmp nl, lip nl, ua + protein, covid neg   Imaging Studies ordered:  I ordered imaging studies including left hand  I independently visualized and interpreted imaging which showed nl I agree with the radiologist interpretation   Cardiac Monitoring:  The patient was maintained on a cardiac monitor.  I personally viewed and interpreted the cardiac monitored which showed an underlying rhythm of: nsr   Medicines ordered and prescription drug management:  I ordered medication including zofran, toradol, and ivfs  for sx  Reevaluation of the patient after these medicines showed that the patient improved I have reviewed the patients home medicines and have made adjustments as needed  Critical Interventions:  ivfs  Problem List / ED Course:  Subconjunctival hemorrhage bilaterally:  This occurred while vomiting.   N/v:  pt is no longer nauseous.  He is tolerating po fluids.  His labs are  nl.  Pt is stable for d/c.  Return if worse.  F/u with pcp.   Reevaluation:  After the interventions noted above, I reevaluated the patient and found that they have :improved   Social Determinants of Health:  Lives at home   Dispostion:  After consideration of the diagnostic results and the patients response to treatment, I feel that the patent would benefit from discharge with outpatient f/u.  Final Clinical Impression(s) / ED Diagnoses Final diagnoses:  Subconjunctival hemorrhage of both eyes  Nausea and vomiting, unspecified vomiting type  Viral upper respiratory tract infection    Rx / DC Orders ED Discharge Orders          Ordered    ondansetron (ZOFRAN-ODT) 4 MG disintegrating tablet  Every 8 hours PRN        07/31/22 1235              Jacalyn Lefevre, MD 07/31/22 1248

## 2022-07-31 NOTE — ED Provider Triage Note (Signed)
Emergency Medicine Provider Triage Evaluation Note  Dylan [name]. , a 26 y.o. male  was evaluated in triage.  Pt complains of eye redness and blurry vision in the right eye, nausea, vomiting, chills.  Patient states for the past 2 days he has had chills intermittently with some nausea and vomiting.  Last episode of vomiting was at 4 AM this morning.  Patient states that after one of the episodes of vomiting yesterday his eyes became significantly red.  He states he had another episode similar to this within the past 3 months.  He does not wear glasses or contacts.  He has never been evaluated by ophthalmology for this problem.  He denies shortness of breath, chest pain, urinary symptoms  Review of Systems  Positive: Eye redness, blurry vision, vomiting Negative: Chest pain, shortness of breath  Physical Exam  BP (!) 144/91   Pulse 98   SpO2 100%  Gen:   Awake, no distress   Resp:  Normal effort  MSK:   Moves extremities without difficulty  Other:  Possible subconjunctival hemorrhage in bilateral eyes  Medical Decision Making  Medically screening exam initiated at 9:14 AM.  Appropriate orders placed.  Dylan [name]. was informed that the remainder of the evaluation will be completed by another provider, this initial triage assessment does not replace that evaluation, and the importance of remaining in the ED until their evaluation is complete.     Darrick Grinder, [place] 07/31/22 [phone]

## 2022-07-31 NOTE — ED Triage Notes (Signed)
Pt reports noticing redness to both eyes a few days ago and then yesterday began vomiting. Pt denies abdominal pain, back pain, chest pain, and diarrhea. Redness to bilateral eyes noted.

## 2022-08-07 ENCOUNTER — Telehealth: Payer: Self-pay | Admitting: Neurology

## 2022-08-07 DIAGNOSIS — H5712 Ocular pain, left eye: Secondary | ICD-10-CM

## 2022-08-07 DIAGNOSIS — G7000 Myasthenia gravis without (acute) exacerbation: Secondary | ICD-10-CM

## 2022-08-07 NOTE — Telephone Encounter (Signed)
I called the patient and left a VM (as per DPR). I informed him that a referral was submitted for ophthalmology and provided the office number for a return call, if needed.

## 2022-08-07 NOTE — Telephone Encounter (Signed)
Patient was seen at [facility] clinic, noted to have left conjunctiva congestion, blood shot red,  ongoing for 2 weeks, mild irritation, prior to that, similar involvement of the right eye,   he has never seen ophthalmologist before,  Myasthenia gravis is under good control, taking prednisone 5 mg 2 tablets, advised him to decrease to 7.5 mg daily,  Refer to ophthalmologist

## 2022-08-08 ENCOUNTER — Telehealth: Payer: Self-pay | Admitting: Neurology

## 2022-08-08 NOTE — Telephone Encounter (Signed)
Sent to Dr. Groat ph # [phone] 

## 2022-08-14 NOTE — Progress Notes (Deleted)
No chief complaint on file.     ASSESSMENT AND PLAN  Dylan [name]. is a 26 y.o. male   Seropositive generalized myasthenia gravis  Acetylcholine receptor binding antibody was +7.58 on December 29, 2019, acetylcholine blocking antibody was +34  CT chest showed no thymus pathology  He responded very well to prednisone, and CellCept treatment,  Prednisone treatment since January 06, 2020, 60 mg to begin with,    CellCept since March 2021, he is tolerating well, keep 500 mg 3 tablets twice a day,   IVIG: He was given IVIG in July 2021, could not tolerate it developed severe headache during first dose IV infusion, continued throughout the day, recurrent headache-second trial, could not tolerate IVIG  he responded well to prednisone treatment, but whenever prednisone dosage was decreased to below 30 mg daily, he would develop proximal muscle weakness, despite Mestinon 3 to 4 tablets daily  Solaris was started in March 2022,    900 mg weekly for the first 4 weeks, followed by   1200 mg for the fifth dose 1 week later, on May 4th 2022   1200 mg every 2 weeks thereafter.  He responded very well to Solaris treatment,   We began prednisone tapering from 10 to 5mg  since July 2022, when prednisone dosage was decreased to 5 mg daily, he did not notice any significant muscle weakness, but noticed neck pain, diffuse muscle achiness, there is slight neck flexion, bilateral shoulder abduction weakness, lower dose of prednisone,  Will keep current prednisone dosage of 10 mg daily, CellCept 1500 mg twice a day, continue Solaris infusion high risk management  Laboratory evaluations  DIAGNOSTIC DATA (LABS, IMAGING, TESTING) - I reviewed patient records, labs, notes, testing and imaging myself where available. CT of the chest in February 2021 was normal, no evidence of thymic pathology  MRI of the brain with without contrast February 2021 was normal  Positive acetylcholine antibody in January  2021, binding antibody 7.58, modulating antibody 38, blocking antibody 34,  HISTORICAL Dylan [name]. is a 26 year old male, seen in request by my colleague Dr. Jaynee Eagles for evaluation of myasthenia gravis, initial evaluation was on January 06, 2020.  I have reviewed and summarized the referring note from the referring physician.  He was seen by Dr. Jaynee Eagles on January 2021, complained of 2 years history of muscle weakness, laboratory evaluation showed positive acetylcholine blocking antibody, binding antibody with titer of 7.58, mild elevated CPK 457  He used to play sports regularly, basketball, wrestling, and workout regularly, in August 2018, when he went back to workout after a month off, he noticed generalized weakness, difficulty moving around, difficulty lifting weight, his weakness intermittent, but overall gradually getting worse over the past 2 years, sometimes he is so weak to the point of could not lift a soda can, has difficulty walking, fell to the floor, could not get himself up, he denies significant ptosis, diplopia, denies swallowing difficulty, does feel short winded with minimum exertion,  He works as a Copy delivery person, 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, 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[employer] He denies sensory loss, he complains of diffuse muscle achy pain  UPDATE Jan 26 2020: He is taking prednisone 40 mg daily, noticed mild improvement while taking 60 mg daily, Mestinon 60 mg 3 times daily has helped his symptoms, but he still has intermittent weakness, "crushed after a while", he denies swallowing difficulty, no breathing difficulty, felt chest pressure sometimes  I personally reviewed  CT chest, no thymus pathology noted  MRI of the brain and cervical spine showed no significant abnormality  Laboratory evaluations, positive acetylcholine receptor binding, blocking, and modulating antibodies, elevated SSA 5.9, normal and negative RPR, hepatitis C, rheumatoid  factor, heavy metal, vitamin B1, 6, methylmalonic acid level, A1c, lactic acid, , magnesium, TSH, HIV, B12,  Normal IgA level, mild elevated IgG 1793,  UPDATE February 23 2020: He was started on prednisone 60 mg since January 06, 2020, has been on prednisone 40 mg daily since January 24, 2020, tolerating it well, on higher dose of Mestinon 60 mg 4 times a day, complains of bloating, I have prescribed CellCept 1500 mg twice a day, still in the process of preauthorization, IVIG still pending  If he misses Mestinon, he would have significant weakness, he describes 1 morning when he hold his toddler, he fell to the ground, could not get up, now he learned to time his Mestinon to when he needs to be strong, he took first dose 5 AM, able to pick up his daughter sent her to school at 8 AM, then he takes a nap, second dose was around 2 PM, then 5 PM, 9 PM.  UPDATE May 02 2020: He is doing very well, tolerating CellCept 1500 mg twice a day, he missed his IVIG appointment, continue taking prednisone 10 mg 4 tablets every morning, Mestinon has decreased from 4 to 3 tablets each day  He now works at a sitting down job at [employer], complains of mild unsteady gait, occasionally bilateral upper extremity weakness, he denies droopy eyelid, double vision, swallowing difficulty, or chewing difficulty, he denies difficulty breathing  He reported his only had 60% of his past despite CellCept 1500 mg daily, prednisone 40 mg daily since February  UPDATE July 03 2020: He return for IVIG treatment, complains of significant headache, during the initial days of IVIG infusion, which still present today, moderate to severe  His myasthenia gravis symptoms overall is under good control with current medications, CellCept 500 mg 3 tablets twice a day, taper down prednisone to 10 mg now, Mestinon 60 mg 3 to 4 tablets daily, he denies significant limitation in daily function  UPDATE Sept 28 2021: Patient is 40 years old daughter  during today's interview, exam was taken 1 hour after Mestinon, there was no significant bulbar, or limb muscle weakness noted  Despite CellCept 500 mg 3 tablets twice a day, prednisone 10 mg daily, he still needs Mestinon 60 mg 4 times a day, if he misses a dose, sometimes he felt difficulty holding his daughter, and also weakness in his tongue,  We attempted IVIG treatment on July 03, 2020, he developed severe headache neck stiffness following the first infusion, similar presentation at the beginning of second infusion, we have to abort the IVIG treatment.  He reported ever since then, he noticed intermittent neck pain, radiating stiffness pain to bilateral shoulder.  UPDATE Jan 09 2021: It took prednisone 10 mg 6 tablets every morning from last visit in September 2021, only in January 2022, he began slow tapering at 10 mg decrement every 2 weeks, He began prednisone 10 mg 3 tablets since January 07, 2021, with decrease of prednisone, he felt fatigue, muscle weakness, locking up, he denies double vision, no swallowing difficulty,  he continued his CellCept 1500 mg twice a day, continue to require Mestinon 60 mg 3-4 times a day,  Today's examination was taken 2 hours after last dose of Mestinon, he was found to have mild  bilateral shoulder abduction, external rotation weakness, slight bilateral hip flexion weakness  We also performed myasthenia gravis activity of daily living (MG-ADL), total score was 12 Talking, intermittent slurred, fatigue with soft food, swallowing rare episode of choking, breathing, shortness of breath at rest sometimes, impairment of ability to brush teeth of comb hair, rest. Needed occasionally, impairment of ability to rise from a chair, moderate, always use arms, double vision, none, droopy eyelid, daily, but not constant  Clinical classification is IIIA, predominantly affecting neck, axilla muscles, lesser involvement of oropharyngeal, respiratory muscles  UPDATE Apr 12 2021: He started Solaris treatment in March 2022, tolerating it very well, it has really controlled his muscle weakness, he is only taking Mestinon 60 mg twice a day, on prednisone 10 mg daily, CellCept 1500 mg twice a day,  UPDATE June 20 2021: He has overall been doing very well, since Solaris treatment, he only use Mestinon as needed, maximum 1 dose a day, no double vision, no difficulty breathing, no significant limb muscle weakness, continued CellCept 500 mg 3 tablets twice a day, started prednisone tapering from 10 mg daily to 7.5 mg on June 14, 2021, tolerating stepping down of the dosage, no flareup weakness  He was noted to have mild moon face, weight gain, central obesity  UPDATE Sept 8 2022: He started prednisone tapering from 7.5 mg to 5 mg in July 2022, he did not notice any recurrent muscle weakness, but noticed neck achiness, intermittent diffuse muscle achiness, on today's examination without Mestinon, he has mild neck flexion, bilateral shoulder abduction weakness,  Update February 11, 2022 He noticed mild muscle weakness, achy pain when prednisone dose it was tapered off of to 5 mg daily, feeling better 10 mg daily, continue with CellCept 1500 twice a day, Solaris, functioning well, rarely take Mestinon  But on today's examination, he continue has mild bilateral upper extremity proximal muscle weakness  Update August 15, 2022 SS:   REVIEW OF SYSTEMS:  Full 14 system review of systems performed and notable only for as above All other review of systems were negative.  PHYSICAL EXAM:   There were no vitals filed for this visit.  Not recorded     There is no height or weight on file to calculate BMI.  PHYSICAL EXAMNIATION:  Gen: NAD, conversant, well nourised, well groomed              NEUROLOGICAL EXAM:  MENTAL STATUS: Speech/cognition:    Awake, alert, oriented to history taking and casual conversation CRANIAL NERVES: CN II: Visual fields are full to  confrontation. Pupils are round equal and briskly reactive to light. CN III, IV, VI: extraocular movement are normal. No ptosis. CN V: Facial sensation is intact to light touch CN VII: Face is symmetric with normal eye closure  CN VIII: Hearing is normal to causal conversation. CN IX, X: Phonation is normal. CN XI: Head turning and shoulder shrug are intact  MOTOR: He has no neck flexion weakness, mild bilateral shoulder abduction external rotation weakness,  REFLEXES: Reflexes are 2+ and symmetric at the biceps, triceps, knees, and ankles. Plantar responses are flexor.  SENSORY: Intact to light touch   COORDINATION: There is no trunk or limb dysmetria noted.  GAIT/STANCE: He can get up from low squatting position without difficulty  ALLERGIES: No Known Allergies  HOME MEDICATIONS: Current Outpatient Medications  Medication Sig Dispense Refill   mycophenolate (CELLCEPT) 500 MG tablet TAKE THREE TABLETS BY MOUTH TWICE A DAY 540 tablet 4  ondansetron (ZOFRAN-ODT) 4 MG disintegrating tablet Take 1 tablet (4 mg total) by mouth every 8 (eight) hours as needed for nausea or vomiting. 20 tablet 0   predniSONE (DELTASONE) 5 MG tablet Take 2 tablets (10 mg total) by mouth daily with breakfast. 180 tablet 3   No current facility-administered medications for this visit.    PAST MEDICAL HISTORY: Past Medical History:  Diagnosis Date   HyperCKemia    MG (myasthenia gravis) ([place])     PAST SURGICAL HISTORY: Past Surgical History:  Procedure Laterality Date   LAPAROSCOPIC APPENDECTOMY N/A 01/10/2017   Procedure: LAPAROSCOPIC APPENDECTOMY;  Surgeon: Greer Pickerel, MD;  [facility];  Service: General;  Laterality: N/A;    FAMILY HISTORY: Family History  Problem Relation Age of Onset   Carpal tunnel syndrome Mother    Diabetes Father    Cancer Paternal Aunt        breast   Stroke Neg Hx    Heart disease Neg Hx    Muscular dystrophy Neg Hx     SOCIAL HISTORY: Social History    Socioeconomic History   Marital status: Single    Spouse name: Not on file   Number of children: 1   Years of education: Not on file   Highest education level: Some college, no degree  Occupational History    Comment: [employer]  Tobacco Use   Smoking status: Never   Smokeless tobacco: Never  Vaping Use   Vaping Use: Never used  Substance and Sexual Activity   Alcohol use: No   Drug use: Not Currently    Comment: 4-5 use/week   Sexual activity: Not on file  Other Topics Concern   Not on file  Social History Narrative   Lives at home with his girlfriend and daughter   Right handed   Drinks no caffeine daily   Social Determinants of Health   Financial Resource Strain: Not on file  Food Insecurity: Not on file  Transportation Needs: Not on file  Physical Activity: Not on file  Stress: Not on file  Social Connections: Not on file  Intimate Partner Violence: Not on file   Butler Denmark, Laqueta Jean, [facility] [address], [place], [place] [address] [phone]

## 2022-08-15 ENCOUNTER — Ambulatory Visit: Payer: BC Managed Care – PPO | Admitting: Neurology

## 2022-08-15 ENCOUNTER — Encounter: Payer: Self-pay | Admitting: Neurology

## 2022-08-20 ENCOUNTER — Telehealth: Payer: Self-pay | Admitting: Neurology

## 2022-08-20 NOTE — Telephone Encounter (Signed)
Give him a follow up with me at my next available follow up spot

## 2022-08-20 NOTE — Telephone Encounter (Signed)
LVM asking pt to call back to schedule follow up 

## 2022-11-26 ENCOUNTER — Encounter: Payer: Self-pay | Admitting: Neurology

## 2022-11-27 ENCOUNTER — Ambulatory Visit
Admission: EM | Admit: 2022-11-27 | Discharge: 2022-11-27 | Disposition: A | Discharge status: Home / Self Care | Payer: BC Managed Care – PPO | Attending: Emergency Medicine | Admitting: Emergency Medicine

## 2022-11-27 ENCOUNTER — Ambulatory Visit: Admit: 2022-11-27 | Payer: BC Managed Care – PPO

## 2022-11-27 DIAGNOSIS — R112 Nausea with vomiting, unspecified: Secondary | ICD-10-CM

## 2022-11-27 DIAGNOSIS — R509 Fever, unspecified: Secondary | ICD-10-CM | POA: Diagnosis not present

## 2022-11-27 DIAGNOSIS — J101 Influenza due to other identified influenza virus with other respiratory manifestations: Secondary | ICD-10-CM | POA: Diagnosis not present

## 2022-11-27 DIAGNOSIS — J3089 Other allergic rhinitis: Secondary | ICD-10-CM

## 2022-11-27 DIAGNOSIS — J302 Other seasonal allergic rhinitis: Secondary | ICD-10-CM

## 2022-11-27 LAB — POCT INFLUENZA A/B
Influenza A, POC: POSITIVE — AB
Influenza B, POC: NEGATIVE

## 2022-11-27 MED ORDER — ONDANSETRON 4 MG PO TBDP: 4.0000 mg | ORAL_TABLET | Freq: Three times a day (TID) | ORAL | 0 refills | Status: AC | PRN

## 2022-11-27 MED ORDER — ACETAMINOPHEN 500 MG PO TABS: 1000.0000 mg | ORAL_TABLET | Freq: Three times a day (TID) | ORAL | 0 refills | Status: AC | PRN

## 2022-11-27 MED ORDER — PSEUDOEPHEDRINE HCL 30 MG PO TABS: 60.0000 mg | ORAL_TABLET | Freq: Three times a day (TID) | ORAL | 0 refills | Status: AC | PRN

## 2022-11-27 MED ORDER — ONDANSETRON HCL 4 MG/2ML IJ SOLN
4.0000 mg | Freq: Once | INTRAMUSCULAR | Status: AC
  Administered 2022-11-27: 4 mg via INTRAMUSCULAR

## 2022-11-27 MED ORDER — OSELTAMIVIR PHOSPHATE 75 MG PO CAPS
75.0000 mg | ORAL_CAPSULE | Freq: Two times a day (BID) | ORAL | 0 refills | Status: AC
Start: 2022-11-27 — End: 2022-12-02

## 2022-11-27 MED ORDER — ONDANSETRON HCL 4 MG/2ML IJ SOLN: 4.0000 mg | Freq: Once | INTRAMUSCULAR | Status: DC

## 2022-11-27 MED ORDER — FLUTICASONE PROPIONATE 50 MCG/ACT NA SUSP: 1.0000 | Freq: Every day | NASAL | 2 refills | Status: AC

## 2022-11-27 MED ORDER — IBUPROFEN 400 MG PO TABS
400.0000 mg | ORAL_TABLET | Freq: Three times a day (TID) | ORAL | 0 refills | Status: AC | PRN
Start: 2022-11-27 — End: ?

## 2022-11-27 MED ORDER — IPRATROPIUM BROMIDE 0.06 % NA SOLN: 2.0000 | Freq: Three times a day (TID) | NASAL | 1 refills | Status: AC

## 2022-11-27 MED ORDER — CETIRIZINE HCL 10 MG PO TABS: 10.0000 mg | ORAL_TABLET | Freq: Every day | ORAL | 1 refills | Status: AC

## 2022-11-27 NOTE — Telephone Encounter (Signed)
There are no OV spots before the end of February. Is that ok or can he be worked in somewhere?

## 2022-11-27 NOTE — Telephone Encounter (Signed)
Please call to give him a follow to discuss MG treatment with me

## 2022-11-27 NOTE — ED Provider Notes (Addendum)
[facility]    CSN: [id number] Arrival date & time: 11/27/22  1031    HISTORY   Chief Complaint  Patient presents with   Generalized Body Aches   Chills   Nasal Congestion   HPI Dylan [name]. is a pleasant, 26 y.o. male who presents to urgent care today. Patient complains of a 2-day history of chills, nasal congestion, runny nose and body ache.  Patient states he is actually had nasal congestion and significant mucus production for the past 5 months.  Patient reports a history of allergies, states they are seasonal and     Past Medical History:  Diagnosis Date   HyperCKemia    MG (myasthenia gravis) (HCC)    Patient Active Problem List   Diagnosis Date Noted   Eye pain, left 08/07/2022   Pure hypercholesterolemia 02/11/2022   High risk medication use 02/11/2022   Complaints of total body pain 08/16/2021   Long-term use of immunosuppressant medication 06/20/2021   Generalized myasthenia gravis (HCC) 04/12/2021   Neck pain 09/05/2020   Myasthenia gravis (HCC) 01/06/2020   Neuromuscular disorder (HCC) 01/06/2020   Weakness 01/06/2020   HyperCKemia    Fall 06/22/2019   Pain of right thumb 05/26/2018   Elevated CK 02/17/2018   Elevated sed rate 02/17/2018   Allergic rhinitis 02/17/2018   Acute maxillary sinusitis 02/17/2018   Muscle weakness 01/19/2018   Dry mouth 01/19/2018   Congestion of throat 01/19/2018   Hoarse 01/19/2018   Past Surgical History:  Procedure Laterality Date   LAPAROSCOPIC APPENDECTOMY N/A 01/10/2017   Procedure: LAPAROSCOPIC APPENDECTOMY;  Surgeon: Gaynelle Adu, MD;  Location: [facility];  Service: General;  Laterality: N/A;    Home Medications    Prior to Admission medications   Medication Sig Start Date End Date Taking? Authorizing Provider  mycophenolate (CELLCEPT) 500 MG tablet TAKE THREE TABLETS BY MOUTH TWICE A DAY 06/27/22   Levert Feinstein, MD  ondansetron (ZOFRAN-ODT) 4 MG disintegrating tablet Take 1 tablet (4 mg total)  by mouth every 8 (eight) hours as needed for nausea or vomiting. 07/31/22   Jacalyn Lefevre, MD  predniSONE (DELTASONE) 5 MG tablet Take 2 tablets (10 mg total) by mouth daily with breakfast. 02/11/22   Levert Feinstein, MD    Family History Family History  Problem Relation Age of Onset   Carpal tunnel syndrome Mother    Diabetes Father    Cancer Paternal Aunt        breast   Stroke Neg Hx    Heart disease Neg Hx    Muscular dystrophy Neg Hx    Social History Social History   Tobacco Use   Smoking status: Never   Smokeless tobacco: Never  Vaping Use   Vaping Use: Never used  Substance Use Topics   Alcohol use: No   Drug use: Not Currently    Comment: 4-5 use/week   Allergies   Patient has no known allergies.  Review of Systems Review of Systems Pertinent findings revealed after performing a 14 point review of systems has been noted in the history of present illness.  Physical Exam Triage Vital Signs ED Triage Vitals  Enc Vitals Group     BP 10/05/21 0827 (!) 147/82     Pulse Rate 10/05/21 0827 72     Resp 10/05/21 0827 18     Temp 10/05/21 0827 98.3 F (36.8 C)     Temp Source 10/05/21 0827 Oral     SpO2 10/05/21 0827 98 %  Weight --      Height --      Head Circumference --      Peak Flow --      Pain Score 10/05/21 0826 5     Pain Loc --      Pain Edu? --      Excl. in GC? --   No data found.  Updated Vital Signs BP 135/86 (BP Location: Right Arm)   Pulse (!) 111   Temp 99.2 F (37.3 C) (Oral)   Resp 17   SpO2 97%   Physical Exam Constitutional:      Appearance: He is ill-appearing.  HENT:     Head: Normocephalic and atraumatic.     Salivary Glands: Right salivary gland is not diffusely enlarged or tender. Left salivary gland is not diffusely enlarged or tender.     Right Ear: Tympanic membrane, ear canal and external ear normal. Tympanic membrane is not injected or erythematous.     Left Ear: Tympanic membrane, ear canal and external ear normal.  Tympanic membrane is not injected or erythematous.     Ears:     Comments: Bilateral EACs normal, both TMs bulging with clear fluid    Nose: Mucosal edema, congestion and rhinorrhea present. No nasal deformity, septal deviation, signs of injury or nasal tenderness. Rhinorrhea is clear.     Right Nostril: Occlusion present. No foreign body, epistaxis or septal hematoma.     Left Nostril: Occlusion present. No foreign body, epistaxis or septal hematoma.     Right Turbinates: Enlarged, swollen and pale.     Left Turbinates: Enlarged, swollen and pale.     Right Sinus: No maxillary sinus tenderness or frontal sinus tenderness.     Left Sinus: No maxillary sinus tenderness or frontal sinus tenderness.     Mouth/Throat:     Mouth: Mucous membranes are moist.     Pharynx: Uvula midline. Pharyngeal swelling, posterior oropharyngeal erythema and uvula swelling present. No oropharyngeal exudate.     Tonsils: No tonsillar exudate. 0 on the right. 0 on the left.     Comments: Postnasal drip Cardiovascular:     Rate and Rhythm: Normal rate and regular rhythm.     Pulses: Normal pulses.  Pulmonary:     Effort: Pulmonary effort is normal. No accessory muscle usage, prolonged expiration or respiratory distress.     Breath sounds: No stridor. No wheezing, rhonchi or rales.     Comments: Turbulent breath sounds throughout without wheeze, rale, rhonchi. Abdominal:     General: Abdomen is flat. Bowel sounds are normal.     Palpations: Abdomen is soft.  Musculoskeletal:        General: Normal range of motion.  Lymphadenopathy:     Cervical: Cervical adenopathy present.     Right cervical: Superficial cervical adenopathy and posterior cervical adenopathy present.     Left cervical: Superficial cervical adenopathy and posterior cervical adenopathy present.  Skin:    General: Skin is warm and dry.  Neurological:     General: No focal deficit present.     Mental Status: He is alert and oriented to person,  place, and time.     Motor: Motor function is intact.     Coordination: Coordination is intact.     Gait: Gait is intact.     Deep Tendon Reflexes: Reflexes are normal and symmetric.  Psychiatric:        Attention and Perception: Attention and perception normal.  Mood and Affect: Mood and affect normal.        Speech: Speech normal.        Behavior: Behavior normal. Behavior is cooperative.        Thought Content: Thought content normal.     Visual Acuity Right Eye Distance:   Left Eye Distance:   Bilateral Distance:    Right Eye Near:   Left Eye Near:    Bilateral Near:     [facility] Couse / Diagnostics / Procedures:     Radiology No results found.  Procedures Procedures (including critical care time) EKG  Pending results:  Labs Reviewed  POCT INFLUENZA A/B - Abnormal; Notable for the following components:      Result Value   Influenza A, POC Positive (*)    All other components within normal limits    Medications Ordered in UC: Medications  ondansetron (ZOFRAN) injection 4 mg (4 mg Intramuscular Given 11/27/22 1319)    [facility] Diagnoses / Final Clinical Impressions(s)   I have reviewed the triage vital signs and the nursing notes.  Pertinent labs & imaging results that were available during my care of the patient were reviewed by me and considered in my medical decision making (see chart for details).    Final diagnoses:  Influenza A  Nausea and vomiting, unspecified vomiting type  Perennial allergic rhinitis with seasonal variation  Fever with chills   Influenza test positive.  Patient provided with prescription for Tamiflu.  Patient advised to take allergy medications to help alleviate symptoms and also keep his allergies well-controlled which will decrease his risk of future respiratory infections.  Conservative care with medications advised.  Return precautions advised. Please see discharge instructions below for further details of plan of care as provided to  patient. ED Prescriptions     Medication Sig Dispense Auth. Provider   oseltamivir (TAMIFLU) 75 MG capsule Take 1 capsule (75 mg total) by mouth every 12 (twelve) hours for 5 days. 10 capsule Theadora Rama Scales, PA-C   ondansetron (ZOFRAN-ODT) 4 MG disintegrating tablet Take 1 tablet (4 mg total) by mouth every 8 (eight) hours as needed for nausea or vomiting. 20 tablet Theadora Rama Scales, PA-C   pseudoephedrine (SUDAFED) 30 MG tablet Take 2 tablets (60 mg total) by mouth every 8 (eight) hours as needed for up to 3 days for congestion. 18 tablet Theadora Rama Scales, PA-C   acetaminophen (TYLENOL) 500 MG tablet Take 2 tablets (1,000 mg total) by mouth every 8 (eight) hours as needed for up to 30 doses for mild pain or fever. 60 tablet Theadora Rama Scales, PA-C   ibuprofen (ADVIL) 400 MG tablet Take 1 tablet (400 mg total) by mouth every 8 (eight) hours as needed for up to 30 doses. 30 tablet Theadora Rama Scales, PA-C   ipratropium (ATROVENT) 0.06 % nasal spray Place 2 sprays into both nostrils 3 (three) times daily. As needed for nasal congestion, runny nose 15 mL Theadora Rama Scales, PA-C   fluticasone (FLONASE) 50 MCG/ACT nasal spray Place 1 spray into both nostrils daily. Begin by using 2 sprays in each nare daily for 3 to 5 days, then decrease to 1 spray in each nare daily. 15.8 mL Theadora Rama Scales, PA-C   cetirizine (ZYRTEC ALLERGY) 10 MG tablet Take 1 tablet (10 mg total) by mouth at bedtime. 90 tablet Theadora Rama Scales, [place]      PDMP not reviewed this encounter.  Disposition Upon Discharge:  Condition: stable for discharge home Home: take  medications as prescribed; routine discharge instructions as discussed; follow up as advised.  Patient presented with an acute illness with associated systemic symptoms and significant discomfort requiring urgent management. In my opinion, this is a condition that a prudent lay person (someone who possesses an average  knowledge of health and medicine) may potentially expect to result in complications if not addressed urgently such as respiratory distress, impairment of bodily function or dysfunction of bodily organs.   Routine symptom specific, illness specific and/or disease specific instructions were discussed with the patient and/or caregiver at length.   As such, the patient has been evaluated and assessed, work-up was performed and treatment was provided in alignment with urgent care protocols and evidence based medicine.  Patient/parent/caregiver has been advised that the patient may require follow up for further testing and treatment if the symptoms continue in spite of treatment, as clinically indicated and appropriate.  If the patient was tested for COVID-19, Influenza and/or RSV, then the patient/parent/guardian was advised to isolate at home pending the results of his/her diagnostic coronavirus test and potentially longer if they're positive. I have also advised pt that if his/her COVID-19 test returns positive, it's recommended to self-isolate for at least 10 days after symptoms first appeared AND until fever-free for 24 hours without fever reducer AND other symptoms have improved or resolved. Discussed self-isolation recommendations as well as instructions for household member/close contacts as per the [facility] and [place] DHHS, and also gave patient the COVID packet with this information.  Patient/parent/caregiver has been advised to return to the [facility] or PCP in 3-5 days if no better; to PCP or the Emergency Department if new signs and symptoms develop, or if the current signs or symptoms continue to change or worsen for further workup, evaluation and treatment as clinically indicated and appropriate  The patient will follow up with their current PCP if and as advised. If the patient does not currently have a PCP we will assist them in obtaining one.   The patient may need specialty follow up if the symptoms  continue, in spite of conservative treatment and management, for further workup, evaluation, consultation and treatment as clinically indicated and appropriate.  Patient/parent/caregiver verbalized understanding and agreement of plan as discussed.  All questions were addressed during visit.  Please see discharge instructions below for further details of plan.  Discharge Instructions:   Discharge Instructions      Your rapid influenza antigen test today was positive.  I recommend that you begin taking Tamiflu now for treatment of influenza.  I have sent a prescription to your pharmacy.  Tamiflu will reduce the duration and severity of influenza illness.     Please read below to learn more about the medications, dosages and frequencies that I recommend to help alleviate your symptoms and to get you feeling better soon:   Zyrtec (cetirizine): This is an excellent second-generation antihistamine that helps to reduce respiratory inflammatory response to environmental allergens.  In some patients, this medication can cause daytime sleepiness so I recommend that you take 1 tablet daily at bedtime.     Flonase (fluticasone): This is a steroid nasal spray that you use once daily, 1 spray in each nare.  This medication does not work well if you decide to use it only used as you feel you need to, it works best used on a daily basis.  After 3 to 5 days of use, you will notice significant reduction of the inflammation and mucus production that is currently being  caused by exposure to allergens, whether seasonal or environmental.  The most common side effect of this medication is nosebleeds.  If you experience a nosebleed, please discontinue use for 1 week, then feel free to resume.  I have provided you with a prescription.     Atrovent (ipratropium): This is an excellent nasal decongestant spray I have added to your recommended nasal steroid that will not cause rebound congestion, please instill 2 sprays into  each nare with each use.  Because nasal steroids can take several days before they begin to provide full benefit, I recommend that you use this spray in addition to the nasal steroid prescribed for you.  Please use it after you have used your nasal steroid and repeat up to 4 times daily as needed.  I have provided you with a prescription for this medication.       Advil, Motrin (ibuprofen): This is a good anti-inflammatory medication which addresses aches, pains and inflammation of the upper airways that causes sinus and nasal congestion as well as in the lower airways which makes your cough feel tight and sometimes burn.  I recommend that you take between 400 to 600 mg every 6-8 hours as needed.      Tylenol (acetaminophen): This is a good fever reducer.  If your body temperature rises above 101.5 as measured with a thermometer, it is recommended that you take 1,000 mg every 8 hours until your temperature falls below 101.5, please not take more than 3,000 mg of acetaminophen either as a separate medication or as in ingredient in an over-the-counter cold/flu preparation within a 24-hour period.      Sudafed (pseudoephedrine): This is a decongestant.  This medication has to be purchased from the pharmacist counter, I recommend taking 2 tablets, 60 mg, 2-3 times a day as needed to relieve runny nose and sinus drainage.  This medication is available over-the-counter however I have sent a prescription for your convenience.   Zofran (ondansetron): This is a good antinausea medication that you can take every 8 hours as needed for symptoms of nausea and vomiting.  It is a dissolvable tablet that you do not need to take with water, just put it under your tongue and let it melt away.  I have sent a prescription to your pharmacy.   Please follow-up within the next 5-7 days either with your primary care provider or urgent care if your symptoms do not resolve.  If you do not have a primary care provider, we will  assist you in finding one.        Thank you for visiting urgent care today.  We appreciate the opportunity to participate in your care.       This office note has been dictated using Teaching laboratory technicianDragon speech recognition software.  Unfortunately, this method of dictation can sometimes lead to typographical or grammatical errors.  I apologize for your inconvenience in advance if this occurs.  Please do not hesitate to reach out to me if clarification is needed.      Theadora RamaMorgan, Lakoda Raske Scales, PA-C 11/27/22 1332    Theadora RamaMorgan, Adamari Frede AmboyScales, [place] 11/27/22 1333

## 2022-11-27 NOTE — Discharge Instructions (Addendum)
Your rapid influenza antigen test today was positive.  I recommend that you begin taking Tamiflu now for treatment of influenza.  I have sent a prescription to your pharmacy.  Tamiflu will reduce the duration and severity of influenza illness.     Please read below to learn more about the medications, dosages and frequencies that I recommend to help alleviate your symptoms and to get you feeling better soon:   Zyrtec (cetirizine): This is an excellent second-generation antihistamine that helps to reduce respiratory inflammatory response to environmental allergens.  In some patients, this medication can cause daytime sleepiness so I recommend that you take 1 tablet daily at bedtime.     Flonase (fluticasone): This is a steroid nasal spray that you use once daily, 1 spray in each nare.  This medication does not work well if you decide to use it only used as you feel you need to, it works best used on a daily basis.  After 3 to 5 days of use, you will notice significant reduction of the inflammation and mucus production that is currently being caused by exposure to allergens, whether seasonal or environmental.  The most common side effect of this medication is nosebleeds.  If you experience a nosebleed, please discontinue use for 1 week, then feel free to resume.  I have provided you with a prescription.     Atrovent (ipratropium): This is an excellent nasal decongestant spray I have added to your recommended nasal steroid that will not cause rebound congestion, please instill 2 sprays into each nare with each use.  Because nasal steroids can take several days before they begin to provide full benefit, I recommend that you use this spray in addition to the nasal steroid prescribed for you.  Please use it after you have used your nasal steroid and repeat up to 4 times daily as needed.  I have provided you with a prescription for this medication.       Advil, Motrin (ibuprofen): This is a good anti-inflammatory  medication which addresses aches, pains and inflammation of the upper airways that causes sinus and nasal congestion as well as in the lower airways which makes your cough feel tight and sometimes burn.  I recommend that you take between 400 to 600 mg every 6-8 hours as needed.      Tylenol (acetaminophen): This is a good fever reducer.  If your body temperature rises above 101.5 as measured with a thermometer, it is recommended that you take 1,000 mg every 8 hours until your temperature falls below 101.5, please not take more than 3,000 mg of acetaminophen either as a separate medication or as in ingredient in an over-the-counter cold/flu preparation within a 24-hour period.      Sudafed (pseudoephedrine): This is a decongestant.  This medication has to be purchased from the pharmacist counter, I recommend taking 2 tablets, 60 mg, 2-3 times a day as needed to relieve runny nose and sinus drainage.  This medication is available over-the-counter however I have sent a prescription for your convenience.   Zofran (ondansetron): This is a good antinausea medication that you can take every 8 hours as needed for symptoms of nausea and vomiting.  It is a dissolvable tablet that you do not need to take with water, just put it under your tongue and let it melt away.  I have sent a prescription to your pharmacy.   Please follow-up within the next 5-7 days either with your primary care provider or urgent care  if your symptoms do not resolve.  If you do not have a primary care provider, we will assist you in finding one.        Thank you for visiting urgent care today.  We appreciate the opportunity to participate in your care.

## 2022-11-27 NOTE — ED Triage Notes (Signed)
Pt c/o chill, congestion and body aches that began 2 days ago.  Home interventions: tylenol, sudafed

## 2022-12-04 ENCOUNTER — Ambulatory Visit (INDEPENDENT_AMBULATORY_CARE_PROVIDER_SITE_OTHER): Payer: BC Managed Care – PPO | Admitting: Neurology

## 2022-12-04 ENCOUNTER — Encounter: Payer: Self-pay | Admitting: Neurology

## 2022-12-04 VITALS — BP 120/80

## 2022-12-04 DIAGNOSIS — G7000 Myasthenia gravis without (acute) exacerbation: Secondary | ICD-10-CM | POA: Diagnosis not present

## 2022-12-04 DIAGNOSIS — Z79899 Other long term (current) drug therapy: Secondary | ICD-10-CM

## 2022-12-04 NOTE — Progress Notes (Signed)
Chief Complaint  Patient presents with   Follow-up    Rm 15 alone here for MG f/u.       ASSESSMENT AND PLAN  Dylan Niannthony Darnell Pennino Jr. is a 26 y.o. male   Seropositive generalized myasthenia gravis  Acetylcholine receptor binding antibody was +7.58 on December 29, 2019, acetylcholine blocking antibody was +34  CT chest showed no thymus pathology  He responded very well to prednisone, and CellCept treatment,  Prednisone treatment since January 06, 2020, 60 mg to begin with,    CellCept since March 2021, he is tolerating well, keep 500 mg 3 tablets twice a day,   IVIG: He was given IVIG in July 2021, could not tolerate it developed severe headache during first dose IV infusion, continued throughout the day, recurrent headache-second trial, could not tolerate IVIG  he responded well to prednisone treatment, but whenever prednisone dosage was decreased to below 30 mg daily, he would develop proximal muscle weakness, despite Mestinon 3 to 4 tablets daily  Solaris was started in March 2022,   He responded very well to [employer] treatment,   We began prednisone tapering from 10 to 5mg  since July 2022, when prednisone dosage was decreased to 5 mg daily, he did not notice any significant muscle weakness, but noticed neck pain, diffuse muscle achiness, there is slight neck flexion, bilateral shoulder abduction weakness, lower dose of prednisone,  His prednisone was decreased to 7.5 mg daily since March 2023, no worsening of his symptoms, will further decrease to 5 mg daily,  Continue CellCept 1500 mg twice a day  Discussed with patient, he responded well to Solaris, that was frequent every 2 weeks infusion, will change to Ultom[employer], start preauthorization today  Return To Clinic With NP In 6 Months   DIAGNOSTIC DATA (LABS, IMAGING, TESTING) - I reviewed patient records, labs, notes, testing and imaging myself where available. CT of the chest in February 2021 was normal, no evidence of thymic  pathology  MRI of the brain with without contrast February 2021 was normal  Positive acetylcholine antibody in January 2021, binding antibody 7.58, modulating antibody 38, blocking antibody 34,  HISTORICAL Dylan Niannthony Darnell Coss Jr. is a 26 year old male, seen in request by my colleague Dr. Lucia GaskinsAhern for evaluation of myasthenia gravis, initial evaluation was on January 06, 2020.  I have reviewed and summarized the referring note from the referring physician.  He was seen by Dr. Lucia GaskinsAhern on January 2021, complained of 2 years history of muscle weakness, laboratory evaluation showed positive acetylcholine blocking antibody, binding antibody with titer of 7.58, mild elevated CPK 457  He used to play sports regularly, basketball, wrestling, and workout regularly, in August 2018, when he went back to workout after a month off, he noticed generalized weakness, difficulty moving around, difficulty lifting weight, his weakness intermittent, but overall gradually getting worse over the past 2 years, sometimes he is so weak to the point of could not lift a soda can, has difficulty walking, fell to the floor, could not get himself up, he denies significant ptosis, diplopia, denies swallowing difficulty, does feel short winded with minimum exertion,  He works as a Psychologist, [employer] pizza delivery person, 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 building He denies sensory loss, he complains of diffuse muscle achy pain  UPDATE Jan 26 2020: He is taking prednisone 40 mg daily, noticed mild improvement while taking 60 mg daily, Mestinon 60 mg 3 times daily has helped his symptoms, but he still has  intermittent weakness, "crushed after a while", he denies swallowing difficulty, no breathing difficulty, felt chest pressure sometimes  I personally reviewed CT chest, no thymus pathology noted  MRI of the brain and cervical spine showed no significant abnormality  Laboratory evaluations, positive acetylcholine  receptor binding, blocking, and modulating antibodies, elevated SSA 5.9, normal and negative RPR, hepatitis C, rheumatoid factor, heavy metal, vitamin B1, 6, methylmalonic acid level, A1c, lactic acid, , magnesium, TSH, HIV, B12,  Normal IgA level, mild elevated IgG 1793,  UPDATE February 23 2020: He was started on prednisone 60 mg since January 06, 2020, has been on prednisone 40 mg daily since January 24, 2020, tolerating it well, on higher dose of Mestinon 60 mg 4 times a day, complains of bloating, I have prescribed CellCept 1500 mg twice a day, still in the process of preauthorization, IVIG still pending  If he misses Mestinon, he would have significant weakness, he describes 1 morning when he hold his toddler, he fell to the ground, could not get up, now he learned to time his Mestinon to when he needs to be strong, he took first dose 5 AM, able to pick up his daughter sent her to school at 8 AM, then he takes a nap, second dose was around 2 PM, then 5 PM, 9 PM.  UPDATE May 02 2020: He is doing very well, tolerating CellCept 1500 mg twice a day, he missed his IVIG appointment, continue taking prednisone 10 mg 4 tablets every morning, Mestinon has decreased from 4 to 3 tablets each day  He now works at a sitting down job at [employer], complains of mild unsteady gait, occasionally bilateral upper extremity weakness, he denies droopy eyelid, double vision, swallowing difficulty, or chewing difficulty, he denies difficulty breathing  He reported his only had 60% of his past despite CellCept 1500 mg daily, prednisone 40 mg daily since February  UPDATE July 03 2020: He return for IVIG treatment, complains of significant headache, during the initial days of IVIG infusion, which still present today, moderate to severe  His myasthenia gravis symptoms overall is under good control with current medications, CellCept 500 mg 3 tablets twice a day, taper down prednisone to 10 mg now, Mestinon 60 mg 3 to 4  tablets daily, he denies significant limitation in daily function  UPDATE Sept 28 2021: Patient is 71 years old daughter during today's interview, exam was taken 1 hour after Mestinon, there was no significant bulbar, or limb muscle weakness noted  Despite CellCept 500 mg 3 tablets twice a day, prednisone 10 mg daily, he still needs Mestinon 60 mg 4 times a day, if he misses a dose, sometimes he felt difficulty holding his daughter, and also weakness in his tongue,  We attempted IVIG treatment on July 03, 2020, he developed severe headache neck stiffness following the first infusion, similar presentation at the beginning of second infusion, we have to abort the IVIG treatment.  He reported ever since then, he noticed intermittent neck pain, radiating stiffness pain to bilateral shoulder.  UPDATE Jan 09 2021: It took prednisone 10 mg 6 tablets every morning from last visit in September 2021, only in January 2022, he began slow tapering at 10 mg decrement every 2 weeks, He began prednisone 10 mg 3 tablets since January 07, 2021, with decrease of prednisone, he felt fatigue, muscle weakness, locking up, he denies double vision, no swallowing difficulty,  he continued his CellCept 1500 mg twice a day, continue to require Mestinon 60 mg 3-4  times a day,  Today's examination was taken 2 hours after last dose of Mestinon, he was found to have mild bilateral shoulder abduction, external rotation weakness, slight bilateral hip flexion weakness  We also performed myasthenia gravis activity of daily living (MG-ADL), total score was 12 Talking, intermittent slurred, fatigue with soft food, swallowing rare episode of choking, breathing, shortness of breath at rest sometimes, impairment of ability to brush teeth of comb hair, rest. Needed occasionally, impairment of ability to rise from a chair, moderate, always use arms, double vision, none, droopy eyelid, daily, but not constant  Clinical classification is  IIIA, predominantly affecting neck, axilla muscles, lesser involvement of oropharyngeal, respiratory muscles  UPDATE Apr 12 2021: He started Solaris treatment in March 2022, tolerating it very well, it has really controlled his muscle weakness, he is only taking Mestinon 60 mg twice a day, on prednisone 10 mg daily, CellCept 1500 mg twice a day,  UPDATE June 20 2021: He has overall been doing very well, since Solaris treatment, he only use Mestinon as needed, maximum 1 dose a day, no double vision, no difficulty breathing, no significant limb muscle weakness, continued CellCept 500 mg 3 tablets twice a day, started prednisone tapering from 10 mg daily to 7.5 mg on June 14, 2021, tolerating stepping down of the dosage, no flareup weakness  He was noted to have mild moon face, weight gain, central obesity  UPDATE Sept 8 2022: He started prednisone tapering from 7.5 mg to 5 mg in July 2022, he did not notice any recurrent muscle weakness, but noticed neck achiness, intermittent diffuse muscle achiness, on today's examination without Mestinon, he has mild neck flexion, bilateral shoulder abduction weakness,  Update February 11, 2022 He noticed mild muscle weakness, achy pain when prednisone dose it was tapered off of to 5 mg daily, feeling better 10 mg daily, continue with CellCept 1500 twice a day, Solaris, functioning well, rarely take Mestinon  But on today's examination, he continue has mild bilateral upper extremity proximal muscle weakness  UPDATE Dec 04 2022: He has been doing very well, tolerating Solaris infusions every 2 weeks, no longer taking Mestinon, on prednisone 7.5 mg since March 2023, lower dose did not cause any symptoms, CellCept 1500 mg twice daily  Last laboratory evaluation August 2023, normal CBC, CMP,  He had meningococcal vaccine prior to starting Solaris in July 2022.  REVIEW OF SYSTEMS:  Full 14 system review of systems performed and notable only for as above All other  review of systems were negative.  PHYSICAL EXAM:  120/80  PHYSICAL EXAMNIATION:  Gen: NAD, conversant, well nourised, well groomed              NEUROLOGICAL EXAM:  MENTAL STATUS: Speech/cognition:    Awake, alert, oriented to history taking and casual conversation CRANIAL NERVES: CN II: Visual fields are full to confrontation. Pupils are round equal and briskly reactive to light. CN III, IV, VI: extraocular movement are normal. No ptosis. CN V: Facial sensation is intact to light touch CN VII: Face is symmetric with normal eye closure  CN VIII: Hearing is normal to causal conversation. CN IX, X: Phonation is normal. CN XI: Head turning and shoulder shrug are intact  MOTOR: Normal neck flexion, proximal and lower lower extremity strengths,  REFLEXES: Reflexes are 2+ and symmetric at the biceps, triceps, knees, and ankles. Plantar responses are flexor.  SENSORY: Intact to light touch   COORDINATION: There is no trunk or limb dysmetria noted.  GAIT/STANCE:  He can get up from low squatting position without difficulty  ALLERGIES: No Known Allergies  HOME MEDICATIONS: Current Outpatient Medications  Medication Sig Dispense Refill   acetaminophen (TYLENOL) 500 MG tablet Take 2 tablets (1,000 mg total) by mouth every 8 (eight) hours as needed for up to 30 doses for mild pain or fever. 60 tablet 0   cetirizine (ZYRTEC ALLERGY) 10 MG tablet Take 1 tablet (10 mg total) by mouth at bedtime. 90 tablet 1   fluticasone (FLONASE) 50 MCG/ACT nasal spray Place 1 spray into both nostrils daily. Begin by using 2 sprays in each nare daily for 3 to 5 days, then decrease to 1 spray in each nare daily. 15.8 mL 2   ibuprofen (ADVIL) 400 MG tablet Take 1 tablet (400 mg total) by mouth every 8 (eight) hours as needed for up to 30 doses. 30 tablet 0   ipratropium (ATROVENT) 0.06 % nasal spray Place 2 sprays into both nostrils 3 (three) times daily. As needed for nasal congestion, runny nose 15 mL  1   mycophenolate (CELLCEPT) 500 MG tablet TAKE THREE TABLETS BY MOUTH TWICE A DAY 540 tablet 4   ondansetron (ZOFRAN-ODT) 4 MG disintegrating tablet Take 1 tablet (4 mg total) by mouth every 8 (eight) hours as needed for nausea or vomiting. 20 tablet 0   predniSONE (DELTASONE) 5 MG tablet Take 7.5 mg by mouth daily with breakfast.     No current facility-administered medications for this visit.    PAST MEDICAL HISTORY: Past Medical History:  Diagnosis Date   HyperCKemia    MG (myasthenia gravis) (HCC)     PAST SURGICAL HISTORY: Past Surgical History:  Procedure Laterality Date   LAPAROSCOPIC APPENDECTOMY N/A 01/10/2017   Procedure: LAPAROSCOPIC APPENDECTOMY;  Surgeon: Gaynelle Adu, MD;  Location: [facility] OR;  Service: General;  Laterality: N/A;    FAMILY HISTORY: Family History  Problem Relation Age of Onset   Carpal tunnel syndrome Mother    Diabetes Father    Cancer Paternal Aunt        breast   Stroke Neg Hx    Heart disease Neg Hx    Muscular dystrophy Neg Hx     SOCIAL HISTORY: Social History   Socioeconomic History   Marital status: Single    Spouse name: Not on file   Number of children: 1   Years of education: Not on file   Highest education level: Some college, no degree  Occupational History    Comment: [employer]  Tobacco Use   Smoking status: Never   Smokeless tobacco: Never  Vaping Use   Vaping Use: Never used  Substance and Sexual Activity   Alcohol use: No   Drug use: Not Currently    Comment: 4-5 use/week   Sexual activity: Not on file  Other Topics Concern   Not on file  Social History Narrative   Lives at home with his girlfriend and daughter   Right handed   Drinks no caffeine daily   Social Determinants of Health   Financial Resource Strain: Not on file  Food Insecurity: Not on file  Transportation Needs: Not on file  Physical Activity: Not on file  Stress: Not on file  Social Connections: Not on file  Intimate Partner Violence: Not on file       Levert Feinstein, M.D. Ph.D.  [facility] [address], [address] [place], [place] [address] Ph: [phone] Fax: [phone]  CC:  Norm Salt, [place] [address]  [place],  [place] [address]  Norm Salt, PA

## 2023-01-06 ENCOUNTER — Other Ambulatory Visit: Payer: Self-pay

## 2023-01-06 NOTE — Telephone Encounter (Deleted)
Medication refill needed for [facility]

## 2023-01-09 ENCOUNTER — Telehealth: Payer: Self-pay | Admitting: Neurology

## 2023-01-09 MED ORDER — PREDNISONE 5 MG PO TABS: 5.0000 mg | ORAL_TABLET | Freq: Every day | ORAL | 5 refills | Status: DC

## 2023-01-09 NOTE — Telephone Encounter (Signed)
Called pt informed him that the prescription was sent to [facility]. He said he appreciate that a lot.

## 2023-01-09 NOTE — Telephone Encounter (Signed)
Pt called wanting to know if hispredniSONE (DELTASONE) 5 MG tablet refill can be sent to the [facility] on [address] [place], [place] [address]

## 2023-01-09 NOTE — Telephone Encounter (Signed)
Refill sent as requested. 

## 2023-01-27 ENCOUNTER — Encounter: Payer: Self-pay | Admitting: Neurology

## 2023-01-29 ENCOUNTER — Telehealth: Payer: Self-pay | Admitting: Neurology

## 2023-01-29 NOTE — Telephone Encounter (Signed)
Called and LVM for patient. Per dr. Stann Mainland r/s Monday 2/26 appt with an NP. NP's have openings all week can offer patient if he calls back. Can see any NP

## 2023-01-30 ENCOUNTER — Telehealth: Payer: Self-pay | Admitting: Neurology

## 2023-01-30 MED ORDER — PREDNISONE 5 MG PO TABS: 5.0000 mg | ORAL_TABLET | Freq: Every day | ORAL | 3 refills | Status: DC

## 2023-01-30 NOTE — Telephone Encounter (Signed)
Pt  is calling and requesting a refill on predniSONE (DELTASONE) 5 MG tablet. Should be sent to [facility] #[id number] Pt said he didn't get a infusion for two month and he needs this medication filled early.

## 2023-01-30 NOTE — Telephone Encounter (Signed)
Meds ordered this encounter  Medications   predniSONE (DELTASONE) 5 MG tablet    Sig: Take 1 tablet (5 mg total) by mouth daily with breakfast.    Dispense:  90 tablet    Refill:  3

## 2023-02-03 ENCOUNTER — Ambulatory Visit: Payer: Self-pay | Admitting: Neurology

## 2023-02-05 NOTE — Progress Notes (Unsigned)
No chief complaint on file.     ASSESSMENT AND PLAN  Dylan [name]. is a 27 y.o. male   Seropositive generalized myasthenia gravis  Acetylcholine receptor binding antibody was +7.58 on December 29, 2019, acetylcholine blocking antibody was +34  CT chest showed no thymus pathology  He responded very well to prednisone, and CellCept treatment,  Prednisone treatment since January 06, 2020, 60 mg to begin with,    CellCept since March 2021, he is tolerating well, keep 500 mg 3 tablets twice a day,   IVIG: He was given IVIG in July 2021, could not tolerate it developed severe headache during first dose IV infusion, continued throughout the day, recurrent headache-second trial, could not tolerate IVIG  he responded well to prednisone treatment, but whenever prednisone dosage was decreased to below 30 mg daily, he would develop proximal muscle weakness, despite Mestinon 3 to 4 tablets daily  Solaris was started in March 2022,   He responded very well to [employer] treatment,   We began prednisone tapering from 10 to '5mg'$  since July 2022, when prednisone dosage was decreased to 5 mg daily, he did not notice any significant muscle weakness, but noticed neck pain, diffuse muscle achiness, there is slight neck flexion, bilateral shoulder abduction weakness, lower dose of prednisone,  His prednisone was decreased to 7.5 mg daily since March 2023, no worsening of his symptoms, therefore further decrease to 5 mg daily in 11/2022  Ultomiris was started 02/03/2023  Responded very well to treatment after initial infusion, repeat infusion scheduled 02/17/2023  Will check CBC and CMP today  Continue CellCept 1500 mg twice a day  Continue prednisone 5 mg daily     Follow-up in 6 months or call earlier if needed    DIAGNOSTIC DATA (LABS, IMAGING, TESTING) - I reviewed patient records, labs, notes, testing and imaging myself where available. CT of the chest in February 2021 was normal, no evidence  of thymic pathology  MRI of the brain with without contrast February 2021 was normal  Positive acetylcholine antibody in January 2021, binding antibody 7.58, modulating antibody 38, blocking antibody 34,  HISTORICAL Dylan [name]. is a 27 year old male, seen in request by my colleague Dr. Jaynee Eagles for evaluation of myasthenia gravis, initial evaluation was on January 06, 2020.  I have reviewed and summarized the referring note from the referring physician.  He was seen by Dr. Jaynee Eagles on January 2021, complained of 2 years history of muscle weakness, laboratory evaluation showed positive acetylcholine blocking antibody, binding antibody with titer of 7.58, mild elevated CPK 457  He used to play sports regularly, basketball, wrestling, and workout regularly, in August 2018, when he went back to workout after a month off, he noticed generalized weakness, difficulty moving around, difficulty lifting weight, his weakness intermittent, but overall gradually getting worse over the past 2 years, sometimes he is so weak to the point of could not lift a soda can, has difficulty walking, fell to the floor, could not get himself up, he denies significant ptosis, diplopia, denies swallowing difficulty, does feel short winded with minimum exertion,  He works as a Copy delivery person, 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, 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[employer] He denies sensory loss, he complains of diffuse muscle achy pain  UPDATE Jan 26 2020: He is taking prednisone 40 mg daily, noticed mild improvement while taking 60 mg daily, Mestinon 60 mg 3 times daily has helped his symptoms, but he still has intermittent  weakness, "crushed after a while", he denies swallowing difficulty, no breathing difficulty, felt chest pressure sometimes  I personally reviewed CT chest, no thymus pathology noted  MRI of the brain and cervical spine showed no significant abnormality  Laboratory evaluations, positive  acetylcholine receptor binding, blocking, and modulating antibodies, elevated SSA 5.9, normal and negative RPR, hepatitis C, rheumatoid factor, heavy metal, vitamin B1, 6, methylmalonic acid level, A1c, lactic acid, , magnesium, TSH, HIV, B12,  Normal IgA level, mild elevated IgG 1793,  UPDATE February 23 2020: He was started on prednisone 60 mg since January 06, 2020, has been on prednisone 40 mg daily since January 24, 2020, tolerating it well, on higher dose of Mestinon 60 mg 4 times a day, complains of bloating, I have prescribed CellCept 1500 mg twice a day, still in the process of preauthorization, IVIG still pending  If he misses Mestinon, he would have significant weakness, he describes 1 morning when he hold his toddler, he fell to the ground, could not get up, now he learned to time his Mestinon to when he needs to be strong, he took first dose 5 AM, able to pick up his daughter sent her to school at 8 AM, then he takes a nap, second dose was around 2 PM, then 5 PM, 9 PM.  UPDATE May 02 2020: He is doing very well, tolerating CellCept 1500 mg twice a day, he missed his IVIG appointment, continue taking prednisone 10 mg 4 tablets every morning, Mestinon has decreased from 4 to 3 tablets each day  He now works at a sitting down job at [employer], complains of mild unsteady gait, occasionally bilateral upper extremity weakness, he denies droopy eyelid, double vision, swallowing difficulty, or chewing difficulty, he denies difficulty breathing  He reported his only had 60% of his past despite CellCept 1500 mg daily, prednisone 40 mg daily since February  UPDATE July 03 2020: He return for IVIG treatment, complains of significant headache, during the initial days of IVIG infusion, which still present today, moderate to severe  His myasthenia gravis symptoms overall is under good control with current medications, CellCept 500 mg 3 tablets twice a day, taper down prednisone to 10 mg now, Mestinon 60 mg  3 to 4 tablets daily, he denies significant limitation in daily function  UPDATE Sept 28 2021: Patient is 72 years old daughter during today's interview, exam was taken 1 hour after Mestinon, there was no significant bulbar, or limb muscle weakness noted  Despite CellCept 500 mg 3 tablets twice a day, prednisone 10 mg daily, he still needs Mestinon 60 mg 4 times a day, if he misses a dose, sometimes he felt difficulty holding his daughter, and also weakness in his tongue,  We attempted IVIG treatment on July 03, 2020, he developed severe headache neck stiffness following the first infusion, similar presentation at the beginning of second infusion, we have to abort the IVIG treatment.  He reported ever since then, he noticed intermittent neck pain, radiating stiffness pain to bilateral shoulder.  UPDATE Jan 09 2021: It took prednisone 10 mg 6 tablets every morning from last visit in September 2021, only in January 2022, he began slow tapering at 10 mg decrement every 2 weeks, He began prednisone 10 mg 3 tablets since January 07, 2021, with decrease of prednisone, he felt fatigue, muscle weakness, locking up, he denies double vision, no swallowing difficulty,  he continued his CellCept 1500 mg twice a day, continue to require Mestinon 60 mg 3-4 times  a day,  Today's examination was taken 2 hours after last dose of Mestinon, he was found to have mild bilateral shoulder abduction, external rotation weakness, slight bilateral hip flexion weakness  We also performed myasthenia gravis activity of daily living (MG-ADL), total score was 12 Talking, intermittent slurred, fatigue with soft food, swallowing rare episode of choking, breathing, shortness of breath at rest sometimes, impairment of ability to brush teeth of comb hair, rest. Needed occasionally, impairment of ability to rise from a chair, moderate, always use arms, double vision, none, droopy eyelid, daily, but not constant  Clinical  classification is IIIA, predominantly affecting neck, axilla muscles, lesser involvement of oropharyngeal, respiratory muscles  UPDATE Apr 12 2021: He started Solaris treatment in March 2022, tolerating it very well, it has really controlled his muscle weakness, he is only taking Mestinon 60 mg twice a day, on prednisone 10 mg daily, CellCept 1500 mg twice a day,  UPDATE June 20 2021: He has overall been doing very well, since Solaris treatment, he only use Mestinon as needed, maximum 1 dose a day, no double vision, no difficulty breathing, no significant limb muscle weakness, continued CellCept 500 mg 3 tablets twice a day, started prednisone tapering from 10 mg daily to 7.5 mg on June 14, 2021, tolerating stepping down of the dosage, no flareup weakness  He was noted to have mild moon face, weight gain, central obesity  UPDATE Sept 8 2022: He started prednisone tapering from 7.5 mg to 5 mg in July 2022, he did not notice any recurrent muscle weakness, but noticed neck achiness, intermittent diffuse muscle achiness, on today's examination without Mestinon, he has mild neck flexion, bilateral shoulder abduction weakness,  Update February 11, 2022 He noticed mild muscle weakness, achy pain when prednisone dose it was tapered off of to 5 mg daily, feeling better 10 mg daily, continue with CellCept 1500 twice a day, Solaris, functioning well, rarely take Mestinon  But on today's examination, he continue has mild bilateral upper extremity proximal muscle weakness  UPDATE Dec 04 2022: He has been doing very well, tolerating Solaris infusions every 2 weeks, no longer taking Mestinon, on prednisone 7.5 mg since March 2023, lower dose did not cause any symptoms, CellCept 1500 mg twice daily  Last laboratory evaluation August 2023, normal CBC, CMP,  He had meningococcal vaccine prior to starting Solaris in July 2022.   Update 02/05/2023 JM: Doing well since prior visit.  Recently started Ultomiris infusion  (initial infusion 2/26).  Reports having some increased weakness prior to infusion but greatly improved post infusion.  Did have some mid chest musculoskeletal pain the next day but improved quickly after stretching, has not experienced this since.  Next infusion scheduled 3/11.  Denies any visual issues or swallowing difficulties.  Continues on prednisone 5 mg daily and CellCept 1500 mg twice daily.       REVIEW OF SYSTEMS:  Full 14 system review of systems performed and notable only for as above All other review of systems were negative.  PHYSICAL EXAM:  120/80  PHYSICAL EXAMNIATION:  Gen: NAD, conversant, well nourised, well groomed              NEUROLOGICAL EXAM:  MENTAL STATUS: Speech/cognition:    Awake, alert, oriented to history taking and casual conversation CRANIAL NERVES: CN II: Visual fields are full to confrontation. Pupils are round equal and briskly reactive to light. CN III, IV, VI: extraocular movement are normal. No ptosis. CN V: Facial sensation is intact to  light touch CN VII: Face is symmetric with normal eye closure  CN VIII: Hearing is normal to causal conversation. CN IX, X: Phonation is normal. CN XI: Head turning and shoulder shrug are intact  MOTOR: Normal neck flexion, very slight proximal UE and LE weakness otherwise full strength throughout  REFLEXES: Reflexes are 2+ and symmetric at the biceps, triceps, knees, and ankles. Plantar responses are flexor.  SENSORY: Intact to light touch   COORDINATION: There is no trunk or limb dysmetria noted.  GAIT/STANCE: He can get up from low squatting position without difficulty  ALLERGIES: No Known Allergies  HOME MEDICATIONS: Current Outpatient Medications  Medication Sig Dispense Refill   acetaminophen (TYLENOL) 500 MG tablet Take 2 tablets (1,000 mg total) by mouth every 8 (eight) hours as needed for up to 30 doses for mild pain or fever. 60 tablet 0   cetirizine (ZYRTEC ALLERGY) 10 MG tablet  Take 1 tablet (10 mg total) by mouth at bedtime. 90 tablet 1   fluticasone (FLONASE) 50 MCG/ACT nasal spray Place 1 spray into both nostrils daily. Begin by using 2 sprays in each nare daily for 3 to 5 days, then decrease to 1 spray in each nare daily. 15.8 mL 2   ibuprofen (ADVIL) 400 MG tablet Take 1 tablet (400 mg total) by mouth every 8 (eight) hours as needed for up to 30 doses. 30 tablet 0   ipratropium (ATROVENT) 0.06 % nasal spray Place 2 sprays into both nostrils 3 (three) times daily. As needed for nasal congestion, runny nose 15 mL 1   mycophenolate (CELLCEPT) 500 MG tablet TAKE THREE TABLETS BY MOUTH TWICE A DAY 540 tablet 4   ondansetron (ZOFRAN-ODT) 4 MG disintegrating tablet Take 1 tablet (4 mg total) by mouth every 8 (eight) hours as needed for nausea or vomiting. 20 tablet 0   predniSONE (DELTASONE) 5 MG tablet Take 1 tablet (5 mg total) by mouth daily with breakfast. 90 tablet 3   No current facility-administered medications for this visit.    PAST MEDICAL HISTORY: Past Medical History:  Diagnosis Date   HyperCKemia    MG (myasthenia gravis) ([place])     PAST SURGICAL HISTORY: Past Surgical History:  Procedure Laterality Date   LAPAROSCOPIC APPENDECTOMY N/A 01/10/2017   Procedure: LAPAROSCOPIC APPENDECTOMY;  Surgeon: Greer Pickerel, MD;  Location: [place];  Service: General;  Laterality: N/A;    FAMILY HISTORY: Family History  Problem Relation Age of Onset   Carpal tunnel syndrome Mother    Diabetes Father    Cancer Paternal Aunt        breast   Stroke Neg Hx    Heart disease Neg Hx    Muscular dystrophy Neg Hx     SOCIAL HISTORY: Social History   Socioeconomic History   Marital status: Single    Spouse name: Not on file   Number of children: 1   Years of education: Not on file   Highest education level: Some college, no degree  Occupational History    Comment: [employer]  Tobacco Use   Smoking status: Never   Smokeless tobacco: Never  Vaping Use   Vaping Use:  Never used  Substance and Sexual Activity   Alcohol use: No   Drug use: Not Currently    Comment: 4-5 use/week   Sexual activity: Not on file  Other Topics Concern   Not on file  Social History Narrative   Lives at home with his girlfriend and daughter   Right handed  Drinks no caffeine daily   Social Determinants of Radio broadcast assistant Strain: Not on file  Food Insecurity: Not on file  Transportation Needs: Not on file  Physical Activity: Not on file  Stress: Not on file  Social Connections: Not on file  Intimate Partner Violence: Not on file      I spent 21 minutes of face-to-face and non-face-to-face time with patient.  This included previsit chart review, lab review, study review, order entry, electronic health record documentation, patient education and discussion regarding above diagnoses and treatment plan and answered all other questions to patient's satisfaction  Frann Rider, [facility]  [facility] [address] [place], [place] [address]  Phone [phone] Fax [phone] Note: This document was prepared with digital dictation and possible smart phrase technology. Any transcriptional errors that result from this process are unintentional.

## 2023-02-06 ENCOUNTER — Ambulatory Visit: Payer: BC Managed Care – PPO | Admitting: Adult Health

## 2023-02-06 ENCOUNTER — Encounter: Payer: Self-pay | Admitting: Adult Health

## 2023-02-06 VITALS — BP 131/77 | HR 98 | Ht 69.0 in | Wt 236.6 lb

## 2023-02-06 DIAGNOSIS — G7000 Myasthenia gravis without (acute) exacerbation: Secondary | ICD-10-CM

## 2023-02-06 DIAGNOSIS — Z79899 Other long term (current) drug therapy: Secondary | ICD-10-CM | POA: Diagnosis not present

## 2023-02-07 LAB — COMPREHENSIVE METABOLIC PANEL
ALT: 39 IU/L (ref 0–44)
AST: 18 IU/L (ref 0–40)
Albumin/Globulin Ratio: 1.6 (ref 1.2–2.2)
Albumin: 4.7 g/dL (ref 4.3–5.2)
Alkaline Phosphatase: 59 IU/L (ref 44–121)
BUN/Creatinine Ratio: 13 (ref 9–20)
BUN: 12 mg/dL (ref 6–20)
Bilirubin Total: <0.2 mg/dL (ref 0.0–1.2)
CO2: 22 mmol/L (ref 20–29)
Calcium: 9.4 mg/dL (ref 8.7–10.2)
Chloride: 104 mmol/L (ref 96–106)
Creatinine, Ser: 0.93 mg/dL (ref 0.76–1.27)
Globulin, Total: 2.9 g/dL (ref 1.5–4.5)
Glucose: 99 mg/dL (ref 70–99)
Potassium: 4.2 mmol/L (ref 3.5–5.2)
Sodium: 141 mmol/L (ref 134–144)
Total Protein: 7.6 g/dL (ref 6.0–8.5)
eGFR: 116 mL/min/{1.73_m2} (ref >59)

## 2023-02-07 LAB — CBC
Hematocrit: 44.1 % (ref 37.5–51.0)
Hemoglobin: 14.4 g/dL (ref 13.0–17.7)
MCH: 31.5 pg (ref 26.6–33.0)
MCHC: 32.7 g/dL (ref 31.5–35.7)
MCV: 97 fL (ref 79–97)
Platelets: 211 10*3/uL (ref 150–450)
RBC: 4.57 x10E6/uL (ref 4.14–5.80)
RDW: 12.2 % (ref 11.6–15.4)
WBC: 7 10*3/uL (ref 3.4–10.8)

## 2023-08-07 ENCOUNTER — Ambulatory Visit: Payer: BC Managed Care – PPO | Admitting: Neurology

## 2023-08-07 ENCOUNTER — Encounter: Payer: Self-pay | Admitting: Neurology

## 2023-08-19 ENCOUNTER — Encounter: Payer: Self-pay | Admitting: Neurology

## 2023-08-19 NOTE — Telephone Encounter (Signed)
Do you know of this being a side effect? I do not see hair loss listed as a side effect on up to date. Started infusion back in February.

## 2023-08-26 NOTE — Telephone Encounter (Signed)
Please advise patient of Dr. Zannie Cove recommendation and have him schedule a f/u visit with her at her next available as he missed prior scheduled visit in August. Thank you.

## 2023-09-09 ENCOUNTER — Ambulatory Visit: Payer: BC Managed Care – PPO | Admitting: Neurology

## 2023-09-20 ENCOUNTER — Other Ambulatory Visit: Payer: Self-pay | Admitting: Neurology

## 2023-09-20 DIAGNOSIS — G7000 Myasthenia gravis without (acute) exacerbation: Secondary | ICD-10-CM

## 2023-09-29 NOTE — Telephone Encounter (Signed)
Called and LVM for Patient to call back to schedule appointment

## 2023-10-01 ENCOUNTER — Ambulatory Visit: Payer: BC Managed Care – PPO | Admitting: Neurology

## 2023-10-01 MED ORDER — MYCOPHENOLATE MOFETIL 500 MG PO TABS: 1500.0000 mg | ORAL_TABLET | Freq: Two times a day (BID) | ORAL | 4 refills | Status: AC

## 2023-10-01 NOTE — Telephone Encounter (Signed)
Patient in agreement to visit next Wednesday and understands he is being worked in. Also requests cellcept refill to [facility] [address].

## 2023-10-08 ENCOUNTER — Ambulatory Visit (INDEPENDENT_AMBULATORY_CARE_PROVIDER_SITE_OTHER): Payer: BC Managed Care – PPO | Admitting: Neurology

## 2023-10-08 ENCOUNTER — Encounter: Payer: Self-pay | Admitting: Neurology

## 2023-10-08 VITALS — BP 125/78 | HR 96 | Ht 69.0 in | Wt 214.6 lb

## 2023-10-08 DIAGNOSIS — E559 Vitamin D deficiency, unspecified: Secondary | ICD-10-CM | POA: Diagnosis not present

## 2023-10-08 DIAGNOSIS — G7000 Myasthenia gravis without (acute) exacerbation: Secondary | ICD-10-CM

## 2023-10-08 DIAGNOSIS — Z79899 Other long term (current) drug therapy: Secondary | ICD-10-CM

## 2023-10-08 NOTE — Progress Notes (Addendum)
Chief Complaint  Patient presents with   Follow-up    Rm 14. Patient alone, reports balding, but other than that doing well. Patient accidentally ran out of cellcept, hasn't had med in a week but reports feeling okay and wants to know if he can just stay off medication      ASSESSMENT AND PLAN  Dylan [name]. is a 27 y.o. male   Seropositive generalized myasthenia gravis  Acetylcholine receptor binding antibody was +7.58 on December 29, 2019, acetylcholine blocking antibody was +34  CT chest showed no thymus pathology  He responded very well to prednisone, and CellCept treatment,  Prednisone treatment since January 06, 2020, 60 mg to begin with,    CellCept since March 2021, he is tolerating well, keep 500 mg 3 tablets twice a day,   IVIG: He was given IVIG in July 2021, could not tolerate it developed severe headache during first dose IV infusion, continued throughout the day, recurrent headache-second trial, could not tolerate IVIG  he responded well to prednisone treatment, but whenever prednisone dosage was decreased to below 30 mg daily, he would develop proximal muscle weakness, despite Mestinon 3 to 4 tablets daily  Solaris was started in March 2022,   He responded very well to [employer] treatment,   His prednisone dosage was gradually tapered down to 5 mg daily, he complains of proximal shoulder muscle achiness, subjective weakness when not taking prednisone, wants to stay on it,  Ultomiris was started 02/03/2023, much improved, MG-ADL today is 0.   This myasthenia gravis is under excellent control taking Ultomiris every 8 weeks, prednisone 5 mg daily, CellCept 500 mg 3 tablets twice a day, he ran out of his CellCept prescription, stopped taking medicine a week ago, did not notice any difference, we will decrease CellCept to 500 mg 2 tablets twice a day, may further decrease if he has no rebound of his muscle weakness  He is concerned about hair loss, balding, reviewed  related literature, documented hair loss side effect with Ultomiris CellCept use, advised him continue follow-up with dermatologist   Return To Clinic With NP In 6 Months  DIAGNOSTIC DATA (LABS, IMAGING, TESTING) - I reviewed patient records, labs, notes, testing and imaging myself where available. CT of the chest in February 2021 was normal, no evidence of thymic pathology  MRI of the brain with without contrast February 2021 was normal  Positive acetylcholine antibody in January 2021, binding antibody 7.58, modulating antibody 38, blocking antibody 34,  HISTORICAL Dylan [name]. seropositive generalized myasthenia gravis  He began to notice muscle weakness since 2020, mainly involving upper and lower extremity muscles, denies significant bulbar weakness, diagnosis was confirmed by  positive acetylcholine blocking antibody, binding antibody with titer of 7.58, mild elevated CPK 457  CT chest showed no thymus pathology MRI of the brain and cervical spine showed no significant abnormality  He was treated with prednisone up to 60 mg, did notice significant improvement, but was not able to have his weakness under excellent control  Then started CellCept 1500 mg twice a day as steroid sparing agent since March 2021, tolerating it very well,  Even with combination of CellCept and prednisone, he complains of mild to moderate proximal muscle weakness, worsening weakness with prednisone tapering, he had a trial of IVIG in July 2021, could not tolerated with significant headaches, neck stiffness  He was started on Solaris March 2022 until beginning of 2024, then switched to Ultomiris in February 2024, continued to  do well,  myasthenia gravis activity of daily living (MG-ADL), total score was 12 (prior to Solaris treatment) Clinical classification is IIIA, predominantly affecting neck, limb muscles, lesser involvement of oropharyngeal, respiratory muscles  He is tolerating Ultomiris,  initial infusion February 03, 2023, every 8 weeks, also on prednisone 5 mg daily, CellCept 500 mg 3 tablets twice a day, myasthenia gravis standpoint doing very well, but he concerned about hair loss, ran out of CellCept prescription, has not taking it for 1 week, did not notice any difference  REVIEW OF SYSTEMS:  Full 14 system review of systems performed and notable only for as above All other review of systems were negative.  PHYSICAL EXAM:     10/08/2023   11:21 AM 02/06/2023    9:10 AM 12/04/2022   10:42 AM  Vitals with BMI  Height 5\' 9"  5\' 9"    Weight 214 lbs 10 oz 236 lbs 10 oz   BMI 31.68 34.92   Systolic 125 131 272  Diastolic 78 77 80  Pulse 96 98      PHYSICAL EXAMNIATION:  Gen: NAD, conversant, well nourised, well groomed              NEUROLOGICAL EXAM:  MENTAL STATUS: Speech/cognition:    Awake, alert, oriented to history taking and casual conversation CRANIAL NERVES: CN II: Visual fields are full to confrontation. Pupils are round equal and briskly reactive to light. CN III, IV, VI: extraocular movement are normal. No ptosis. CN V: Facial sensation is intact to light touch CN VII: Face is symmetric with normal eye closure  CN VIII: Hearing is normal to causal conversation. CN IX, X: Phonation is normal. CN XI: Head turning and shoulder shrug are intact  MOTOR: Normal neck flexion, very slight proximal UE and LE weakness otherwise full strength throughout  REFLEXES: Reflexes are 2+ and symmetric at the biceps, triceps, knees, and ankles. Plantar responses are flexor.  SENSORY: Intact to light touch   COORDINATION: There is no trunk or limb dysmetria noted.  GAIT/STANCE: He can get up from low squatting position without difficulty  ALLERGIES: No Known Allergies  HOME MEDICATIONS: Current Outpatient Medications  Medication Sig Dispense Refill   acetaminophen (TYLENOL) 500 MG tablet Take 2 tablets (1,000 mg total) by mouth every 8 (eight) hours as  needed for up to 30 doses for mild pain or fever. 60 tablet 0   fluticasone (FLONASE) 50 MCG/ACT nasal spray Place 1 spray into both nostrils daily. Begin by using 2 sprays in each nare daily for 3 to 5 days, then decrease to 1 spray in each nare daily. 15.8 mL 2   ibuprofen (ADVIL) 400 MG tablet Take 1 tablet (400 mg total) by mouth every 8 (eight) hours as needed for up to 30 doses. 30 tablet 0   ipratropium (ATROVENT) 0.06 % nasal spray Place 2 sprays into both nostrils 3 (three) times daily. As needed for nasal congestion, runny nose 15 mL 1   mycophenolate (CELLCEPT) 500 MG tablet Take 3 tablets (1,500 mg total) by mouth 2 (two) times daily. 540 tablet 4   ondansetron (ZOFRAN-ODT) 4 MG disintegrating tablet Take 1 tablet (4 mg total) by mouth every 8 (eight) hours as needed for nausea or vomiting. 20 tablet 0   predniSONE (DELTASONE) 5 MG tablet Take 1 tablet (5 mg total) by mouth daily with breakfast. 90 tablet 3   cetirizine (ZYRTEC ALLERGY) 10 MG tablet Take 1 tablet (10 mg total) by mouth at bedtime. 90 tablet  1   No current facility-administered medications for this visit.    PAST MEDICAL HISTORY: Past Medical History:  Diagnosis Date   HyperCKemia    MG (myasthenia gravis) (HCC)     PAST SURGICAL HISTORY: Past Surgical History:  Procedure Laterality Date   LAPAROSCOPIC APPENDECTOMY N/A 01/10/2017   Procedure: LAPAROSCOPIC APPENDECTOMY;  Surgeon: Gaynelle Adu, MD;  Location: [facility] OR;  Service: General;  Laterality: N/A;    FAMILY HISTORY: Family History  Problem Relation Age of Onset   Carpal tunnel syndrome Mother    Diabetes Father    Cancer Paternal Aunt        breast   Stroke Neg Hx    Heart disease Neg Hx    Muscular dystrophy Neg Hx     SOCIAL HISTORY: Social History   Socioeconomic History   Marital status: Single    Spouse name: Not on file   Number of children: 1   Years of education: Not on file   Highest education level: Some college, no degree   Occupational History    Comment: [employer]  Tobacco Use   Smoking status: Never   Smokeless tobacco: Never  Vaping Use   Vaping status: Never Used  Substance and Sexual Activity   Alcohol use: No   Drug use: Not Currently    Comment: 4-5 use/week   Sexual activity: Not on file  Other Topics Concern   Not on file  Social History Narrative   Lives at home with his girlfriend and daughter   Right handed   Drinks no caffeine daily   Social Determinants of Health   Financial Resource Strain: Not on file  Food Insecurity: Not on file  Transportation Needs: Not on file  Physical Activity: Not on file  Stress: Not on file  Social Connections: Not on file  Intimate Partner Violence: Not on file     Levert Feinstein, M.D. Ph.D.  [facility] [address] [place], [place] [address] Phone: [phone] Fax:      [phone]

## 2023-10-09 ENCOUNTER — Other Ambulatory Visit: Payer: Self-pay | Admitting: Neurology

## 2023-10-09 DIAGNOSIS — E559 Vitamin D deficiency, unspecified: Secondary | ICD-10-CM

## 2023-10-09 LAB — CBC WITH DIFFERENTIAL/PLATELET
Basophils Absolute: 0 10*3/uL (ref 0.0–0.2)
Basos: 0 %
EOS (ABSOLUTE): 0 10*3/uL (ref 0.0–0.4)
Eos: 0 %
Hematocrit: 47.3 % (ref 37.5–51.0)
Hemoglobin: 15 g/dL (ref 13.0–17.7)
Immature Grans (Abs): 0 10*3/uL (ref 0.0–0.1)
Immature Granulocytes: 1 %
Lymphocytes Absolute: 0.9 10*3/uL (ref 0.7–3.1)
Lymphs: 20 %
MCH: 31.1 pg (ref 26.6–33.0)
MCHC: 31.7 g/dL (ref 31.5–35.7)
MCV: 98 fL — ABNORMAL HIGH (ref 79–97)
Monocytes Absolute: 0.4 10*3/uL (ref 0.1–0.9)
Monocytes: 10 %
Neutrophils Absolute: 3.1 10*3/uL (ref 1.4–7.0)
Neutrophils: 69 %
Platelets: 199 10*3/uL (ref 150–450)
RBC: 4.83 x10E6/uL (ref 4.14–5.80)
RDW: 12.3 % (ref 11.6–15.4)
WBC: 4.4 10*3/uL (ref 3.4–10.8)

## 2023-10-09 LAB — COMPREHENSIVE METABOLIC PANEL
ALT: 62 [IU]/L — ABNORMAL HIGH (ref 0–44)
AST: 32 [IU]/L (ref 0–40)
Albumin: 4.9 g/dL (ref 4.3–5.2)
Alkaline Phosphatase: 69 [IU]/L (ref 44–121)
BUN/Creatinine Ratio: 11 (ref 9–20)
BUN: 11 mg/dL (ref 6–20)
Bilirubin Total: 0.4 mg/dL (ref 0.0–1.2)
CO2: 23 mmol/L (ref 20–29)
Calcium: 9.3 mg/dL (ref 8.7–10.2)
Chloride: 104 mmol/L (ref 96–106)
Creatinine, Ser: 0.96 mg/dL (ref 0.76–1.27)
Globulin, Total: 2.8 g/dL (ref 1.5–4.5)
Glucose: 89 mg/dL (ref 70–99)
Potassium: 4.4 mmol/L (ref 3.5–5.2)
Sodium: 139 mmol/L (ref 134–144)
Total Protein: 7.7 g/dL (ref 6.0–8.5)
eGFR: 112 mL/min/{1.73_m2} (ref >59)

## 2023-10-09 LAB — VITAMIN D 25 HYDROXY (VIT D DEFICIENCY, FRACTURES): Vit D, 25-Hydroxy: 13 ng/mL — ABNORMAL LOW (ref 30.0–100.0)

## 2023-10-09 LAB — HGB A1C W/O EAG: Hgb A1c MFr Bld: 5.6 % (ref 4.8–5.6)

## 2023-10-09 LAB — TSH: TSH: 0.587 u[IU]/mL (ref 0.450–4.500)

## 2023-10-09 MED ORDER — VITAMIN D (ERGOCALCIFEROL) 1.25 MG (50000 UNIT) PO CAPS: 50000.0000 [IU] | ORAL_CAPSULE | ORAL | 0 refills | Status: DC

## 2023-10-21 ENCOUNTER — Telehealth: Payer: Self-pay | Admitting: Neurology

## 2023-10-21 MED ORDER — VITAMIN D (ERGOCALCIFEROL) 1.25 MG (50000 UNIT) PO CAPS: 50000.0000 [IU] | ORAL_CAPSULE | ORAL | 0 refills | Status: DC

## 2023-10-21 NOTE — Telephone Encounter (Signed)
Phone room: Tell pt refill sent Thanks,  Phala Schraeder

## 2023-10-21 NOTE — Telephone Encounter (Signed)
Pt called stating that he is needing his Vitamin D, Ergocalciferol, (DRISDOL) 1.25 MG (50000 UNIT) CAPS capsule sent to the [facility] non [address]

## 2023-11-27 ENCOUNTER — Ambulatory Visit: Payer: BC Managed Care – PPO | Admitting: Neurology

## 2023-12-22 ENCOUNTER — Telehealth: Payer: Self-pay

## 2023-12-22 NOTE — Telephone Encounter (Signed)
 Cassie from [facility] at [facility] called regarding Mr Nurse Ultomiris infusion. She reiterated about him needing to find a new infusion center. She wanted to call and coordinate efforts to find a new location for him. I let her know that it looks like he reached out this morning wanting a referral placed to [facility]. She advised that she has already reached out to them and they are able to accept him as a patient. She stated there is a form on their website that needs to be completed, and that there is a location on that form where urgent can be marked.   She asked for a call back at [phone] to confirm if this referral can be placed.

## 2023-12-22 NOTE — Telephone Encounter (Signed)
 Patient called infusion saying he has a new job. He cannot come to the infusion clinic for IVIG. He can only do infusions on Friday, Saturday or Sunday. He would like authorization to do infusions at [facility].

## 2023-12-23 NOTE — Telephone Encounter (Signed)
 Faxed Ultomiris orders to [facility], left a maessage to for Ulm at [place] to call back.

## 2023-12-23 NOTE — Telephone Encounter (Signed)
 Call to [facility] to confirm Dr. Terrace Arabia in agreement to receive infusions at [facility]. No answer left message to return call back.

## 2023-12-23 NOTE — Telephone Encounter (Signed)
 I discussed with patient, he will have one infusion of Ultomiris AT OUTPATIENT INFUSION unit this time due to work schedule, will continue rest infusion at office again,   I also mentioned [facility] options for him.   May continue discussion at follow up visit

## 2023-12-24 ENCOUNTER — Telehealth: Payer: Self-pay

## 2023-12-24 NOTE — Telephone Encounter (Signed)
 Last 2 visit notes, most recent labs, and vaccine proof sent to [facility]

## 2023-12-24 NOTE — Telephone Encounter (Signed)
 Call to [facility], spoke with Tonita Frater, she confirmed the fax was recieeved and I informed her patient would only need 1 infusion. She will escalte it and feels they can infuse him with in 2 weeks. She will update me on 1/16.   Call to patient to inform [facility] is working on his infusion. Number provided to patient for [facility]

## 2024-01-01 ENCOUNTER — Other Ambulatory Visit: Payer: Self-pay | Admitting: Neurology

## 2024-01-01 NOTE — Telephone Encounter (Signed)
Rreturned call to Star at [facility]. LVM to return call.

## 2024-01-01 NOTE — Telephone Encounter (Signed)
Fax was not legible, emailed vaccination records to [email or web address]

## 2024-01-01 NOTE — Telephone Encounter (Signed)
Star from [facility] is asking for a call from April, RN she can be reached at [phone]

## 2024-01-06 ENCOUNTER — Telehealth: Payer: Self-pay

## 2024-01-07 NOTE — Telephone Encounter (Signed)
[facility], Star returning phone call. Transferred to April

## 2024-01-09 ENCOUNTER — Other Ambulatory Visit: Payer: Self-pay | Admitting: Neurology

## 2024-03-02 ENCOUNTER — Telehealth: Payer: Self-pay

## 2024-03-02 NOTE — Telephone Encounter (Signed)
 Call to patient, no answer. Left vm to call back and discuss alternative to ultomiris. Will discuss Zil[facility]

## 2024-03-10 ENCOUNTER — Telehealth: Payer: Self-pay

## 2024-03-16 ENCOUNTER — Telehealth: Payer: Self-pay

## 2024-03-16 NOTE — Telephone Encounter (Signed)
 [facility] Larita Fife. Want to attempt every avenue to try to contact the patient due to he is due his infusion. Have left voicemail with no response. Would like a call back,  can contact at [phone]

## 2024-03-16 NOTE — Telephone Encounter (Signed)
 Returned call to Dylan [name], she states they patient returned their call and are attempting to get scheduled.

## 2024-04-19 ENCOUNTER — Encounter: Payer: Self-pay | Admitting: Adult Health

## 2024-04-19 ENCOUNTER — Ambulatory Visit: Payer: BC Managed Care – PPO | Admitting: Adult Health

## 2024-04-19 NOTE — Progress Notes (Deleted)
 No chief complaint on file.     ASSESSMENT AND PLAN  Dylan Dalomba. is a 28 y.o. male   Seropositive generalized myasthenia gravis  Acetylcholine receptor binding antibody was +7.58 on December 29, 2019, acetylcholine blocking antibody was +34  CT chest showed no thymus pathology  He responded very well to prednisone , and CellCept  treatment,  Prednisone  treatment since January 06, 2020, 60 mg to begin with,    CellCept  since March 2021, he is tolerating well, keep 500 mg 3 tablets twice a day,   IVIG: He was given IVIG in July 2021, could not tolerate it developed severe headache during first dose IV infusion, continued throughout the day, recurrent headache-second trial, could not tolerate IVIG  he responded well to prednisone  treatment, but whenever prednisone  dosage was decreased to below 30 mg daily, he would develop proximal muscle weakness, despite Mestinon  3 to 4 tablets daily  Solaris was started in March 2022,   He responded very well to [employer] treatment,   We began prednisone  tapering from 10 to 5mg  since July 2022, when prednisone  dosage was decreased to 5 mg daily, he did not notice any significant muscle weakness, but noticed neck pain, diffuse muscle achiness, there is slight neck flexion, bilateral shoulder abduction weakness, lower dose of prednisone ,  His prednisone  was decreased to 7.5 mg daily since March 2023, no worsening of his symptoms, therefore further decrease to 5 mg daily in 11/2022  Ultomiris was started 02/03/2023  Continues to do well on Ultomiris, repeat infusion scheduled ***  Will check CBC and CMP today  Continue CellCept  1500 mg twice a day  Continue prednisone  5 mg daily     Follow-up in 6 months or call earlier if needed    DIAGNOSTIC DATA (LABS, IMAGING, TESTING) - I reviewed patient records, labs, notes, testing and imaging myself where available. CT of the chest in February 2021 was normal, no evidence of thymic pathology  MRI  of the brain with without contrast February 2021 was normal  Positive acetylcholine antibody in January 2021, binding antibody 7.58, modulating antibody 38, blocking antibody 34,  HISTORICAL Dylan [name]. is a 28 year old male, seen in request by my colleague Dr. Tresia Fruit for evaluation of myasthenia gravis, initial evaluation was on January 06, 2020.  I have reviewed and summarized the referring note from the referring physician.  He was seen by Dr. Tresia Fruit on January 2021, complained of 2 years history of muscle weakness, laboratory evaluation showed positive acetylcholine blocking antibody, binding antibody with titer of 7.58, mild elevated CPK 457  He used to play sports regularly, basketball, wrestling, and workout regularly, in August 2018, when he went back to workout after a month off, he noticed generalized weakness, difficulty moving around, difficulty lifting weight, his weakness intermittent, but overall gradually getting worse over the past 2 years, sometimes he is so weak to the point of could not lift a soda can, has difficulty walking, fell to the floor, could not get himself up, he denies significant ptosis, diplopia, denies swallowing difficulty, does feel short winded with minimum exertion,  He works as a Psychologist, clinical delivery person, 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 He denies sensory loss, he complains of diffuse muscle achy pain  UPDATE Jan 26 2020: He is taking prednisone  40 mg daily, noticed mild improvement while taking 60 mg daily, Mestinon  60 mg 3 times daily has helped his symptoms, but he still has intermittent weakness, "crushed  after a while", he denies swallowing difficulty, no breathing difficulty, felt chest pressure sometimes  I personally reviewed CT chest, no thymus pathology noted  MRI of the brain and cervical spine showed no significant abnormality  Laboratory evaluations, positive acetylcholine receptor binding,  blocking, and modulating antibodies, elevated SSA 5.9, normal and negative RPR, hepatitis C, rheumatoid factor, heavy metal, vitamin B1, 6, methylmalonic acid level, A1c, lactic acid, , magnesium, TSH, HIV, B12,  Normal IgA level, mild elevated IgG 1793,  UPDATE February 23 2020: He was started on prednisone  60 mg since January 06, 2020, has been on prednisone  40 mg daily since January 24, 2020, tolerating it well, on higher dose of Mestinon  60 mg 4 times a day, complains of bloating, I have prescribed CellCept  1500 mg twice a day, still in the process of preauthorization, IVIG still pending  If he misses Mestinon , he would have significant weakness, he describes 1 morning when he hold his toddler, he fell to the ground, could not get up, now he learned to time his Mestinon  to when he needs to be strong, he took first dose 5 AM, able to pick up his daughter sent her to school at 8 AM, then he takes a nap, second dose was around 2 PM, then 5 PM, 9 PM.  UPDATE May 02 2020: He is doing very well, tolerating CellCept  1500 mg twice a day, he missed his IVIG appointment, continue taking prednisone  10 mg 4 tablets every morning, Mestinon  has decreased from 4 to 3 tablets each day  He now works at a sitting down job at [employer], complains of mild unsteady gait, occasionally bilateral upper extremity weakness, he denies droopy eyelid, double vision, swallowing difficulty, or chewing difficulty, he denies difficulty breathing  He reported his only had 60% of his past despite CellCept  1500 mg daily, prednisone  40 mg daily since February  UPDATE July 03 2020: He return for IVIG treatment, complains of significant headache, during the initial days of IVIG infusion, which still present today, moderate to severe  His myasthenia gravis symptoms overall is under good control with current medications, CellCept  500 mg 3 tablets twice a day, taper down prednisone  to 10 mg now, Mestinon  60 mg 3 to 4 tablets daily, he denies  significant limitation in daily function  UPDATE Sept 28 2021: Patient is 89 years old daughter during today's interview, exam was taken 1 hour after Mestinon , there was no significant bulbar, or limb muscle weakness noted  Despite CellCept  500 mg 3 tablets twice a day, prednisone  10 mg daily, he still needs Mestinon  60 mg 4 times a day, if he misses a dose, sometimes he felt difficulty holding his daughter, and also weakness in his tongue,  We attempted IVIG treatment on July 03, 2020, he developed severe headache neck stiffness following the first infusion, similar presentation at the beginning of second infusion, we have to abort the IVIG treatment.  He reported ever since then, he noticed intermittent neck pain, radiating stiffness pain to bilateral shoulder.  UPDATE Jan 09 2021: It took prednisone  10 mg 6 tablets every morning from last visit in September 2021, only in January 2022, he began slow tapering at 10 mg decrement every 2 weeks, He began prednisone  10 mg 3 tablets since January 07, 2021, with decrease of prednisone , he felt fatigue, muscle weakness, locking up, he denies double vision, no swallowing difficulty,  he continued his CellCept  1500 mg twice a day, continue to require Mestinon  60 mg 3-4 times a day,  Today's examination was taken 2 hours after last dose of Mestinon , he was found to have mild bilateral shoulder abduction, external rotation weakness, slight bilateral hip flexion weakness  We also performed myasthenia gravis activity of daily living (MG-ADL), total score was 12 Talking, intermittent slurred, fatigue with soft food, swallowing rare episode of choking, breathing, shortness of breath at rest sometimes, impairment of ability to brush teeth of comb hair, rest. Needed occasionally, impairment of ability to rise from a chair, moderate, always use arms, double vision, none, droopy eyelid, daily, but not constant  Clinical classification is IIIA, predominantly  affecting neck, axilla muscles, lesser involvement of oropharyngeal, respiratory muscles  UPDATE Apr 12 2021: He started Solaris treatment in March 2022, tolerating it very well, it has really controlled his muscle weakness, he is only taking Mestinon  60 mg twice a day, on prednisone  10 mg daily, CellCept  1500 mg twice a day,  UPDATE June 20 2021: He has overall been doing very well, since Solaris treatment, he only use Mestinon  as needed, maximum 1 dose a day, no double vision, no difficulty breathing, no significant limb muscle weakness, continued CellCept  500 mg 3 tablets twice a day, started prednisone  tapering from 10 mg daily to 7.5 mg on June 14, 2021, tolerating stepping down of the dosage, no flareup weakness  He was noted to have mild moon face, weight gain, central obesity  UPDATE Sept 8 2022: He started prednisone  tapering from 7.5 mg to 5 mg in July 2022, he did not notice any recurrent muscle weakness, but noticed neck achiness, intermittent diffuse muscle achiness, on today's examination without Mestinon , he has mild neck flexion, bilateral shoulder abduction weakness,  Update February 11, 2022 He noticed mild muscle weakness, achy pain when prednisone  dose it was tapered off of to 5 mg daily, feeling better 10 mg daily, continue with CellCept  1500 twice a day, Solaris, functioning well, rarely take Mestinon   But on today's examination, he continue has mild bilateral upper extremity proximal muscle weakness  UPDATE Dec 04 2022: He has been doing very well, tolerating Solaris infusions every 2 weeks, no longer taking Mestinon , on prednisone  7.5 mg since March 2023, lower dose did not cause any symptoms, CellCept  1500 mg twice daily  Last laboratory evaluation August 2023, normal CBC, CMP,  He had meningococcal vaccine prior to starting Solaris in July 2022.   Update 02/05/2023 JM: Doing well since prior visit.  Recently started Ultomiris infusion (initial infusion 2/26).  Reports  having some increased weakness prior to infusion but greatly improved post infusion.  Did have some mid chest musculoskeletal pain the next day but improved quickly after stretching, has not experienced this since.  Next infusion scheduled 3/11.  Denies any visual issues or swallowing difficulties.  Continues on prednisone  5 mg daily and CellCept  1500 mg twice daily.   Update 10/08/2023 Dr.Yan: Dylan Aly. seropositive generalized myasthenia gravis   He began to notice muscle weakness since 2020, mainly involving upper and lower extremity muscles, denies significant bulbar weakness, diagnosis was confirmed by  positive acetylcholine blocking antibody, binding antibody with titer of 7.58, mild elevated CPK 457   CT chest showed no thymus pathology MRI of the brain and cervical spine showed no significant abnormality   He was treated with prednisone  up to 60 mg, did notice significant improvement, but was not able to have his weakness under excellent control   Then started CellCept  1500 mg twice a day as steroid sparing agent since March 2021, tolerating  it very well,   Even with combination of CellCept  and prednisone , he complains of mild to moderate proximal muscle weakness, worsening weakness with prednisone  tapering, he had a trial of IVIG in July 2021, could not tolerated with significant headaches, neck stiffness   He was started on Solaris March 2022 until beginning of 2024, then switched to Ultomiris in February 2024, continued to do well,   myasthenia gravis activity of daily living (MG-ADL), total score was 12 (prior to Solaris treatment) Clinical classification is IIIA, predominantly affecting neck, limb muscles, lesser involvement of oropharyngeal, respiratory muscles   He is tolerating Ultomiris, initial infusion February 03, 2023, every 8 weeks, also on prednisone  5 mg daily, CellCept  500 mg 3 tablets twice a day, myasthenia gravis standpoint doing very well, but he  concerned about hair loss, ran out of CellCept  prescription, has not taking it for 1 week, did not notice any difference     Update 04/19/2024 JM: returns today for follow up visit. Continues to do well from MG standpoint. Continues with Ultomiris, last infusion ***.  Also on CellCept  500mg  *** and prednisone  5mg  daily.           REVIEW OF SYSTEMS:  Full 14 system review of systems performed and notable only for as above All other review of systems were negative.  PHYSICAL EXAM:  120/80  PHYSICAL EXAMNIATION:  Gen: NAD, conversant, well nourised, well groomed              NEUROLOGICAL EXAM:  MENTAL STATUS: Speech/cognition:    Awake, alert, oriented to history taking and casual conversation CRANIAL NERVES: CN II: Visual fields are full to confrontation. Pupils are round equal and briskly reactive to light. CN III, IV, VI: extraocular movement are normal. No ptosis. CN V: Facial sensation is intact to light touch CN VII: Face is symmetric with normal eye closure  CN VIII: Hearing is normal to causal conversation. CN IX, X: Phonation is normal. CN XI: Head turning and shoulder shrug are intact  MOTOR: Normal neck flexion, very slight proximal UE and LE weakness otherwise full strength throughout  REFLEXES: Reflexes are 2+ and symmetric at the biceps, triceps, knees, and ankles. Plantar responses are flexor.  SENSORY: Intact to light touch   COORDINATION: There is no trunk or limb dysmetria noted.  GAIT/STANCE: He can get up from low squatting position without difficulty  ALLERGIES: No Known Allergies  HOME MEDICATIONS: Current Outpatient Medications  Medication Sig Dispense Refill   acetaminophen  (TYLENOL ) 500 MG tablet Take 2 tablets (1,000 mg total) by mouth every 8 (eight) hours as needed for up to 30 doses for mild pain or fever. 60 tablet 0   cetirizine  (ZYRTEC  ALLERGY) 10 MG tablet Take 1 tablet (10 mg total) by mouth at bedtime. 90 tablet 1    fluticasone  (FLONASE ) 50 MCG/ACT nasal spray Place 1 spray into both nostrils daily. Begin by using 2 sprays in each nare daily for 3 to 5 days, then decrease to 1 spray in each nare daily. 15.8 mL 2   ibuprofen  (ADVIL ) 400 MG tablet Take 1 tablet (400 mg total) by mouth every 8 (eight) hours as needed for up to 30 doses. 30 tablet 0   ipratropium (ATROVENT ) 0.06 % nasal spray Place 2 sprays into both nostrils 3 (three) times daily. As needed for nasal congestion, runny nose 15 mL 1   mycophenolate  (CELLCEPT ) 500 MG tablet Take 3 tablets (1,500 mg total) by mouth 2 (two) times daily. 540 tablet 4  ondansetron  (ZOFRAN -ODT) 4 MG disintegrating tablet Take 1 tablet (4 mg total) by mouth every 8 (eight) hours as needed for nausea or vomiting. 20 tablet 0   predniSONE  (DELTASONE ) 5 MG tablet TAKE 1 TABLET BY MOUTH EVERY DAY WITH BREAKFAST 90 tablet 2   Vitamin D , Ergocalciferol , (DRISDOL ) 1.25 MG (50000 UNIT) CAPS capsule TAKE 1 CAPSULE BY MOUTH ONCE WEEKLY 10 capsule 0   No current facility-administered medications for this visit.    PAST MEDICAL HISTORY: Past Medical History:  Diagnosis Date   HyperCKemia    MG (myasthenia gravis) (HCC)     PAST SURGICAL HISTORY: Past Surgical History:  Procedure Laterality Date   LAPAROSCOPIC APPENDECTOMY N/A 01/10/2017   Procedure: LAPAROSCOPIC APPENDECTOMY;  Surgeon: Aldean Hummingbird, MD;  [facility];  Service: General;  Laterality: N/A;    FAMILY HISTORY: Family History  Problem Relation Age of Onset   Carpal tunnel syndrome Mother    Diabetes Father    Cancer Paternal Aunt        breast   Stroke Neg Hx    Heart disease Neg Hx    Muscular dystrophy Neg Hx     SOCIAL HISTORY: Social History   Socioeconomic History   Marital status: Single    Spouse name: Not on file   Number of children: 1   Years of education: Not on file   Highest education level: Some college, no degree  Occupational History    Comment: [employer]  Tobacco Use   Smoking  status: Never   Smokeless tobacco: Never  Vaping Use   Vaping status: Never Used  Substance and Sexual Activity   Alcohol use: No   Drug use: Not Currently    Comment: 4-5 use/week   Sexual activity: Not on file  Other Topics Concern   Not on file  Social History Narrative   Lives at home with his girlfriend and daughter   Right handed   Drinks no caffeine daily   Social Drivers of Corporate investment banker Strain: Not on file  Food Insecurity: Not on file  Transportation Needs: Not on file  Physical Activity: Not on file  Stress: Not on file  Social Connections: Not on file  Intimate Partner Violence: Not on file      I spent 21 minutes of face-to-face and non-face-to-face time with patient.  This included previsit chart review, lab review, study review, order entry, electronic health record documentation, patient education and discussion regarding above diagnoses and treatment plan and answered all other questions to patient's satisfaction  Johny Nap, [facility]  [facility] [address] [place], [place] [address]  Phone [phone] Fax [phone] Note: This document was prepared with digital dictation and possible smart phrase technology. Any transcriptional errors that result from this process are unintentional.

## 2024-11-29 ENCOUNTER — Telehealth: Payer: Self-pay | Admitting: Neurology

## 2024-11-29 NOTE — Telephone Encounter (Signed)
 [facility] (Dylan [name]) calling to check if patient getting Ultomiris infusions. Patient not answering phones regards to infusion. Would like a call back.

## 2024-11-29 NOTE — Telephone Encounter (Signed)
 Left message for patient to call to relay he needs to call the infusion center.

## 2024-12-07 ENCOUNTER — Other Ambulatory Visit: Payer: Self-pay | Admitting: Neurology

## 2024-12-07 NOTE — Telephone Encounter (Signed)
 Last seen on 10/08/23   Prednisone  treatment since January 06, 2020, 60 mg  No follow up scheduled
